# Patient Record
Sex: Female | Born: 1975 | Race: Black or African American | Hispanic: No | Marital: Married | Smoking: Current every day smoker
Health system: Southern US, Community
[De-identification: ages and names within clinical notes are randomized; demographics above are authoritative.]

## PROBLEM LIST (undated history)

## (undated) DIAGNOSIS — T4145XA Adverse effect of unspecified anesthetic, initial encounter: Secondary | ICD-10-CM

## (undated) DIAGNOSIS — N83209 Unspecified ovarian cyst, unspecified side: Secondary | ICD-10-CM

## (undated) DIAGNOSIS — F419 Anxiety disorder, unspecified: Secondary | ICD-10-CM

## (undated) DIAGNOSIS — Z9889 Other specified postprocedural states: Secondary | ICD-10-CM

## (undated) DIAGNOSIS — T8859XA Other complications of anesthesia, initial encounter: Secondary | ICD-10-CM

## (undated) DIAGNOSIS — R112 Nausea with vomiting, unspecified: Secondary | ICD-10-CM

## (undated) DIAGNOSIS — I10 Essential (primary) hypertension: Secondary | ICD-10-CM

## (undated) DIAGNOSIS — F329 Major depressive disorder, single episode, unspecified: Secondary | ICD-10-CM

## (undated) DIAGNOSIS — S92909A Unspecified fracture of unspecified foot, initial encounter for closed fracture: Secondary | ICD-10-CM

## (undated) DIAGNOSIS — F32A Depression, unspecified: Secondary | ICD-10-CM

## (undated) DIAGNOSIS — D259 Leiomyoma of uterus, unspecified: Secondary | ICD-10-CM

## (undated) DIAGNOSIS — K219 Gastro-esophageal reflux disease without esophagitis: Secondary | ICD-10-CM

## (undated) HISTORY — DX: Essential (primary) hypertension: I10

## (undated) HISTORY — PX: EYE SURGERY: SHX253

## (undated) HISTORY — PX: FOOT SURGERY: SHX648

## (undated) HISTORY — PX: HERNIA REPAIR: SHX51

## (undated) HISTORY — DX: Gastro-esophageal reflux disease without esophagitis: K21.9

## (undated) HISTORY — DX: Unspecified fracture of unspecified foot, initial encounter for closed fracture: S92.909A

---

## 1998-02-24 HISTORY — PX: TUBAL LIGATION: SHX77

## 2005-02-24 HISTORY — PX: NOVASURE ABLATION: SHX5394

## 2008-02-25 DIAGNOSIS — S92909A Unspecified fracture of unspecified foot, initial encounter for closed fracture: Secondary | ICD-10-CM

## 2008-02-25 HISTORY — DX: Unspecified fracture of unspecified foot, initial encounter for closed fracture: S92.909A

## 2010-04-05 ENCOUNTER — Emergency Department: Payer: Self-pay | Admitting: Emergency Medicine

## 2010-06-03 ENCOUNTER — Emergency Department: Payer: Self-pay | Admitting: Emergency Medicine

## 2011-09-16 ENCOUNTER — Emergency Department: Payer: Self-pay | Admitting: *Deleted

## 2011-09-18 LAB — BETA STREP CULTURE(ARMC)

## 2011-10-03 ENCOUNTER — Emergency Department: Payer: Self-pay | Admitting: Emergency Medicine

## 2012-04-01 ENCOUNTER — Emergency Department: Payer: Self-pay | Admitting: Emergency Medicine

## 2013-09-27 ENCOUNTER — Encounter: Payer: Self-pay | Admitting: General Surgery

## 2013-09-27 ENCOUNTER — Ambulatory Visit (INDEPENDENT_AMBULATORY_CARE_PROVIDER_SITE_OTHER): Payer: BC Managed Care – PPO | Admitting: General Surgery

## 2013-09-27 VITALS — BP 116/78 | HR 78 | Resp 12 | Ht 59.0 in | Wt 192.0 lb

## 2013-09-27 DIAGNOSIS — N764 Abscess of vulva: Secondary | ICD-10-CM

## 2013-09-27 NOTE — Progress Notes (Signed)
Patient ID: Deborah [name], female   DOB: July 28, 1975, 38 y.o.   MRN: [id number]  Chief Complaint  Patient presents with  . Other    New Pt evaluation of abscess on labia    HPI Deborah [name] is a 38 y.o. female who presents for an evaluation of an abscess on the labia. She states she noticed it approximately 1 week. She states as of last night it started to drain. She denies any fever or chills. She was prescribed an antibiotic yesterday but has not started it yet. She is having pain in this area. The patient has had abscesses in this area at least 4-5 times in the past.   HPI  Past Medical History  Diagnosis Date  . Broken foot 2010  . GERD (gastroesophageal reflux disease)     Past Surgical History  Procedure Laterality Date  . Tubal ligation  2000  . Eye surgery      tear duck    Family History  Problem Relation Age of Onset  . Hypertension Mother   . Hypertension Father   . Diabetes Sister     Social History History  Substance Use Topics  . Smoking status: Current Every Day Smoker -- 1.00 packs/day for 20 years  . Smokeless tobacco: Not on file  . Alcohol Use: Yes    No Known Allergies  Current Outpatient Prescriptions  Medication Sig Dispense Refill  . citalopram (CELEXA) 20 MG tablet Take 1 tablet by mouth daily.      Marland Kitchen lisinopril (PRINIVIL,ZESTRIL) 10 MG tablet Take 1 tablet by mouth daily.      . metFORMIN (GLUCOPHAGE) 500 MG tablet Take 1 tablet by mouth daily.      Marland Kitchen NEXIUM 40 MG capsule Take 1 capsule by mouth daily.      Marland Kitchen sulfamethoxazole-trimethoprim (BACTRIM DS) 800-160 MG per tablet        No current facility-administered medications for this visit.    Review of Systems Review of Systems  Constitutional: Negative.   Respiratory: Negative.   Cardiovascular: Negative.     Blood pressure 116/78, pulse 78, resp. rate 12, height 4\' 11"  (1.499 m), weight 192 lb (87.091 kg), last menstrual period 09/14/2013.  Physical Exam Physical Exam   Constitutional: She is oriented to person, place, and time. She appears well-developed and well-nourished.  Genitourinary:  Left labial skin 5 mm opening in the middle 2 cm induration. No active drainage.   Lymphadenopathy:       Right: No inguinal adenopathy present.       Left: No inguinal adenopathy present.  Neurological: She is alert and oriented to person, place, and time.  Skin: Skin is warm and dry.    Data Reviewed    Assessment    Left labial abscess.By history she has had recurrent abscesses in labial skin.      Plan    Patient to return in 4 weeks for follow up. Complete course of antibiotic prescribed.       [place] Natarajan G 09/27/2013, 9:46 AM

## 2013-09-27 NOTE — Patient Instructions (Addendum)
Patient to return in 4 weeks for follow up. Patient to start on antibiotics and complete 1 week course.  Also advised on clipping hair from the labial area once a week for hygiene. The patient is aware to call back for any questions or concerns.

## 2013-10-26 ENCOUNTER — Ambulatory Visit: Payer: BC Managed Care – PPO | Admitting: General Surgery

## 2013-11-10 ENCOUNTER — Encounter: Payer: Self-pay | Admitting: *Deleted

## 2013-12-27 ENCOUNTER — Encounter: Payer: Self-pay | Admitting: General Surgery

## 2014-03-17 ENCOUNTER — Emergency Department: Payer: Self-pay | Admitting: Emergency Medicine

## 2015-01-08 DIAGNOSIS — I10 Essential (primary) hypertension: Secondary | ICD-10-CM | POA: Insufficient documentation

## 2015-01-08 DIAGNOSIS — R7303 Prediabetes: Secondary | ICD-10-CM | POA: Insufficient documentation

## 2015-02-20 ENCOUNTER — Emergency Department
Admission: EM | Admit: 2015-02-20 | Discharge: 2015-02-20 | Disposition: A | Discharge status: Home / Self Care | Payer: BLUE CROSS/BLUE SHIELD | Attending: Emergency Medicine | Admitting: Emergency Medicine

## 2015-02-20 ENCOUNTER — Emergency Department: Payer: BLUE CROSS/BLUE SHIELD

## 2015-02-20 DIAGNOSIS — N946 Dysmenorrhea, unspecified: Secondary | ICD-10-CM | POA: Insufficient documentation

## 2015-02-20 DIAGNOSIS — N83202 Unspecified ovarian cyst, left side: Secondary | ICD-10-CM | POA: Insufficient documentation

## 2015-02-20 DIAGNOSIS — F17200 Nicotine dependence, unspecified, uncomplicated: Secondary | ICD-10-CM | POA: Insufficient documentation

## 2015-02-20 DIAGNOSIS — Z79899 Other long term (current) drug therapy: Secondary | ICD-10-CM | POA: Diagnosis not present

## 2015-02-20 DIAGNOSIS — Z3202 Encounter for pregnancy test, result negative: Secondary | ICD-10-CM | POA: Diagnosis not present

## 2015-02-20 DIAGNOSIS — R102 Pelvic and perineal pain: Secondary | ICD-10-CM

## 2015-02-20 DIAGNOSIS — R109 Unspecified abdominal pain: Secondary | ICD-10-CM | POA: Diagnosis present

## 2015-02-20 HISTORY — DX: Unspecified ovarian cyst, unspecified side: N83.209

## 2015-02-20 HISTORY — DX: Leiomyoma of uterus, unspecified: D25.9

## 2015-02-20 LAB — URINALYSIS COMPLETE WITH MICROSCOPIC (ARMC ONLY)
Bilirubin Urine: NEGATIVE
Glucose, UA: NEGATIVE mg/dL
KETONES UR: NEGATIVE mg/dL
Leukocytes, UA: NEGATIVE
Nitrite: NEGATIVE
PH: 5 (ref 5.0–8.0)
PROTEIN: 30 mg/dL — AB
SPECIFIC GRAVITY, URINE: 1.015 (ref 1.005–1.030)

## 2015-02-20 LAB — COMPREHENSIVE METABOLIC PANEL
ALK PHOS: 61 U/L (ref 38–126)
ALT: 21 U/L (ref 14–54)
ANION GAP: 9 (ref 5–15)
AST: 24 U/L (ref 15–41)
Albumin: 4.4 g/dL (ref 3.5–5.0)
BUN: 11 mg/dL (ref 6–20)
CALCIUM: 9.2 mg/dL (ref 8.9–10.3)
CHLORIDE: 107 mmol/L (ref 101–111)
CO2: 23 mmol/L (ref 22–32)
Creatinine, Ser: 0.82 mg/dL (ref 0.44–1.00)
GFR calc non Af Amer: >60 mL/min (ref >60)
Glucose, Bld: 104 mg/dL — ABNORMAL HIGH (ref 65–99)
POTASSIUM: 3.7 mmol/L (ref 3.5–5.1)
SODIUM: 139 mmol/L (ref 135–145)
Total Bilirubin: 0.5 mg/dL (ref 0.3–1.2)
Total Protein: 7.7 g/dL (ref 6.5–8.1)

## 2015-02-20 LAB — CBC
HCT: 41.6 % (ref 35.0–47.0)
HEMOGLOBIN: 13.8 g/dL (ref 12.0–16.0)
MCH: 29.3 pg (ref 26.0–34.0)
MCHC: 33.2 g/dL (ref 32.0–36.0)
MCV: 88.2 fL (ref 80.0–100.0)
Platelets: 322 10*3/uL (ref 150–440)
RBC: 4.72 MIL/uL (ref 3.80–5.20)
RDW: 14.6 % — ABNORMAL HIGH (ref 11.5–14.5)
WBC: 12.2 10*3/uL — ABNORMAL HIGH (ref 3.6–11.0)

## 2015-02-20 LAB — CHLAMYDIA/NGC RT PCR (ARMC ONLY)
CHLAMYDIA TR: NOT DETECTED
N GONORRHOEAE: NOT DETECTED

## 2015-02-20 LAB — WET PREP, GENITAL
Sperm: NONE SEEN
TRICH WET PREP: NONE SEEN
YEAST WET PREP: NONE SEEN

## 2015-02-20 LAB — LIPASE, BLOOD: LIPASE: 20 U/L (ref 11–51)

## 2015-02-20 LAB — POCT PREGNANCY, URINE: Preg Test, Ur: NEGATIVE

## 2015-02-20 MED ORDER — TRAMADOL HCL 50 MG PO TABS: 50.0000 mg | ORAL_TABLET | Freq: Four times a day (QID) | ORAL | Status: DC | PRN

## 2015-02-20 MED ORDER — OXYCODONE-ACETAMINOPHEN 5-325 MG PO TABS: 1.0000 | ORAL_TABLET | Freq: Once | ORAL | Status: DC

## 2015-02-20 MED ORDER — HYDROCODONE-ACETAMINOPHEN 5-325 MG PO TABS
1.0000 | ORAL_TABLET | Freq: Once | ORAL | Status: AC
  Administered 2015-02-20: 1 via ORAL

## 2015-02-20 MED ORDER — HYDROCODONE-ACETAMINOPHEN 5-325 MG PO TABS
ORAL_TABLET | ORAL | Status: AC
  Administered 2015-02-20: 1 via ORAL
  Filled 2015-02-20: qty 1

## 2015-02-20 MED ORDER — OXYCODONE-ACETAMINOPHEN 5-325 MG PO TABS
ORAL_TABLET | ORAL | Status: AC
  Filled 2015-02-20: qty 1

## 2015-02-20 NOTE — ED Notes (Signed)
Upon looking at prescription during discharge, pt states "I am not 245 lb, I am about 205 lb." Weight changed to 205 lb by this RN in chart.

## 2015-02-20 NOTE — ED Notes (Signed)
Patient transported to Ultrasound via stretcher 

## 2015-02-20 NOTE — ED Notes (Signed)
Pt returned from US

## 2015-02-20 NOTE — Discharge Instructions (Signed)

## 2015-02-20 NOTE — ED Notes (Signed)
Pt c/o lower abd pain for the past 3 days, worse today with normal period , states she has a hx of uterine fibroids and ovarian cyst that are worse when having her period.

## 2015-02-20 NOTE — ED Notes (Signed)

## 2015-02-20 NOTE — ED Provider Notes (Signed)
[facility] Emergency Department Provider Note  ____________________________________________   I have reviewed the triage vital signs and the nursing notes.   HISTORY  Chief Complaint Abdominal Pain    HPI Deborah [name] is a 39 y.o. female with a history of ovarian cysts, very painful uterine fibroids presents today complaining of pain during her menses. She has had it this bad before but not usually. She does also have some pain on the left side of her uterus which makes her concerned that she has a concomitant ovarian cyst which she has had multiple times. Her OB GYN has offered her a a hysterectomy but she prefer not to have it done. The patient is no longer thinking of having children however. She does not have significant vaginal bleeding she is on birth control for that and it is well-controlled.  Past Medical History  Diagnosis Date  . Broken foot 2010  . GERD (gastroesophageal reflux disease)   . Uterine fibroid   . Ovarian cyst     There are no active problems to display for this patient.   Past Surgical History  Procedure Laterality Date  . Tubal ligation  2000  . Eye surgery      tear duck    Current Outpatient Rx  Name  Route  Sig  Dispense  Refill  . ibuprofen (ADVIL,MOTRIN) 200 MG tablet   Oral   Take 200 mg by mouth every 6 (six) hours as needed.         Marland Kitchen lisinopril (PRINIVIL,ZESTRIL) 10 MG tablet   Oral   Take 1 tablet by mouth daily.         Marland Kitchen omeprazole (PRILOSEC) 20 MG capsule   Oral   Take 20 mg by mouth daily.           Allergies Review of patient's allergies indicates no known allergies.  Family History  Problem Relation Age of Onset  . Hypertension Mother   . Hypertension Father   . Diabetes Sister     Social History Social History  Substance Use Topics  . Smoking status: Current Every Day Smoker -- 1.00 packs/day for 20 years  . Smokeless tobacco: None  . Alcohol Use: Yes    Review of  Systems Constitutional: No fever/chills Eyes: No visual changes. ENT: No sore throat. No stiff neck no neck pain Cardiovascular: Denies chest pain. Respiratory: Denies shortness of breath. Gastrointestinal:   no vomiting.  No diarrhea.  No constipation. Genitourinary: Negative for dysuria. Musculoskeletal: Negative lower extremity swelling Skin: Negative for rash. Neurological: Negative for headaches, focal weakness or numbness. 10-point ROS otherwise negative.  ____________________________________________   PHYSICAL EXAM:  VITAL SIGNS: ED Triage Vitals  Enc Vitals Group     BP 02/20/15 1732 177/103 mmHg     Pulse Rate 02/20/15 1732 75     Resp 02/20/15 1732 18     Temp 02/20/15 1732 98.2 F (36.8 C)     Temp Source 02/20/15 1732 Oral     SpO2 02/20/15 1732 100 %     Weight 02/20/15 1732 245 lb (111.131 kg)     Height 02/20/15 1732 5\' 4"  (1.626 m)     Head Cir --      Peak Flow --      Pain Score 02/20/15 1732 10     Pain Loc --      Pain Edu? --      Excl. in [place]? --     Constitutional: Alert and oriented. Well  appearing and in no acute distress. Eyes: Conjunctivae are normal. PERRL. EOMI. Head: Atraumatic. Nose: No congestion/rhinnorhea. Mouth/Throat: Mucous membranes are moist.  Oropharynx non-erythematous. Neck: No stridor.   Nontender with no meningismus Cardiovascular: Normal rate, regular rhythm. Grossly normal heart sounds.  Good peripheral circulation. Respiratory: Normal respiratory effort.  No retractions. Lungs CTAB. Abdominal: Soft and nontender. No distention. No guarding no rebound Back:  There is no focal tenderness or step off there is no midline tenderness there are no lesions noted. there is no CVA tenderness Pelvic exam: Female nurse chaperone present, no external lesions noted, physiologic vaginal discharge noted with no purulent discharge, no cervical motion tenderness, significant obesity limits exam, there is scant vaginal bleeding, there is  normal uterine discomfort and there is some mild tenderness to palpation in the left adnexa.  Musculoskeletal: No lower extremity tenderness. No joint effusions, no DVT signs strong distal pulses no edema Neurologic:  Normal speech and language. No gross focal neurologic deficits are appreciated.  Skin:  Skin is warm, dry and intact. No rash noted. Psychiatric: Mood and affect are normal. Speech and behavior are normal.  ____________________________________________   LABS (all labs ordered are listed, but only abnormal results are displayed)  Labs Reviewed  WET PREP, GENITAL - Abnormal; Notable for the following:    Clue Cells Wet Prep HPF POC FEW (*)    WBC, Wet Prep HPF POC FEW (*)    All other components within normal limits  COMPREHENSIVE METABOLIC PANEL - Abnormal; Notable for the following:    Glucose, Bld 104 (*)    All other components within normal limits  CBC - Abnormal; Notable for the following:    WBC 12.2 (*)    RDW 14.6 (*)    All other components within normal limits  URINALYSIS COMPLETEWITH MICROSCOPIC ([facility] ONLY) - Abnormal; Notable for the following:    Color, Urine YELLOW (*)    APPearance CLEAR (*)    Hgb urine dipstick 3+ (*)    Protein, ur 30 (*)    Bacteria, UA RARE (*)    Squamous Epithelial / LPF 6-30 (*)    All other components within normal limits  CHLAMYDIA/NGC RT PCR ([facility] ONLY)  LIPASE, BLOOD  POC URINE PREG, ED  POCT PREGNANCY, URINE   ____________________________________________  EKG  I personally interpreted any EKGs ordered by me or triage  ____________________________________________  RADIOLOGY  I reviewed any imaging ordered by me or triage that were performed during my shift ____________________________________________   PROCEDURES  Procedure(s) performed: None  Critical Care performed: None  ____________________________________________   INITIAL IMPRESSION / ASSESSMENT AND PLAN / ED COURSE  Pertinent labs &  imaging results that were available during my care of the patient were reviewed by me and considered in my medical decision making (see chart for details).  We'll obtain a ultrasound to rule out torsion although I have low suspicion patient is very control after the pain medication that she received in triage. I suspect this is her chronic symptoms and barring unexpected findings on ultrasound anticipate the patient will follow closely with her OB/GYN for this chronic recurrent problem. ____________________________________________   FINAL CLINICAL IMPRESSION(S) / ED DIAGNOSES  Final diagnoses:  Pelvic pain in female     Schuyler Amor, MD 02/20/15 2156

## 2015-03-26 ENCOUNTER — Encounter: Payer: Self-pay | Admitting: Obstetrics and Gynecology

## 2015-04-11 ENCOUNTER — Encounter: Payer: Self-pay | Admitting: Obstetrics and Gynecology

## 2015-04-11 ENCOUNTER — Ambulatory Visit (INDEPENDENT_AMBULATORY_CARE_PROVIDER_SITE_OTHER): Payer: BLUE CROSS/BLUE SHIELD | Admitting: Obstetrics and Gynecology

## 2015-04-11 VITALS — BP 125/85 | HR 80 | Ht 59.0 in | Wt 212.0 lb

## 2015-04-11 DIAGNOSIS — R102 Pelvic and perineal pain: Principal | ICD-10-CM

## 2015-04-11 DIAGNOSIS — G8929 Other chronic pain: Secondary | ICD-10-CM

## 2015-04-11 DIAGNOSIS — Z9851 Tubal ligation status: Secondary | ICD-10-CM | POA: Diagnosis not present

## 2015-04-11 DIAGNOSIS — Z9889 Other specified postprocedural states: Secondary | ICD-10-CM

## 2015-04-11 DIAGNOSIS — N949 Unspecified condition associated with female genital organs and menstrual cycle: Secondary | ICD-10-CM | POA: Diagnosis not present

## 2015-04-11 DIAGNOSIS — N946 Dysmenorrhea, unspecified: Secondary | ICD-10-CM

## 2015-04-11 NOTE — Patient Instructions (Addendum)
1.  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  This is best treated with hysterectomy. 2.  History and physical is suspicious for pre-existing endometriosis.. 3.   Laparoscopic-assisted vaginal hysterectomy, bilateral salpingectomy, and left oophorectomy is to be scheduled within the next month. 4.  Return in approximately 4 weeks for preop appointment

## 2015-04-11 NOTE — Progress Notes (Signed)
GYN ENCOUNTER NOTE  Subjective:       Deborah [name] is a 40 y.o. G2P2 female is here for gynecologic evaluation of the following issues:  1. Chronic pelvic pain.  Patient is a 40 year old African-American female, divorced, para 2002, status post BTL for contraception, status post NovaSure endometrial ablation (unsuccessful), with worsening chronic pelvic pain in the form of dysmenorrhea and dyspareunia, presents for evaluation for possible surgical intervention..     Gynecologic History Menarche-age 79. Cycles.-Monthly. Duration of flow-less than 7 days. Dysmenorrhea-moderate to severe. Pelvic pain-central cramping, left-sided lower pelvic discomfort and low back ache with radiation to buttocks and left thigh. No previous diagnosis of endometriosis. History of worsening deep thrusting dyspareunia.. Status post NovaSure endometrial ablation approximately 2007 Patient's last menstrual period was 03/14/2015 (approximate). Contraception: tubal ligation Last Pap: normal.   Obstetric History OB History  Gravida Para Term Preterm AB SAB TAB Ectopic Multiple Living  2 2        2     # Outcome Date GA Lbr Len/2nd Weight Sex Delivery Anes PTL Lv  2 Para      Vag-Spont     1 Para      Vag-Spont       Obstetric Comments  1st Menstrual Cycle:  9  1st Pregnancy:  13    Past Medical History  Diagnosis Date  . Broken foot 2010  . GERD (gastroesophageal reflux disease)   . Uterine fibroid   . Ovarian cyst   . Hypertension   . Ovarian cyst     Past Surgical History  Procedure Laterality Date  . Tubal ligation  2000  . Eye surgery      tear duck  . Foot surgery Left   . Hernia repair    . Novasure ablation  2007    Current Outpatient Prescriptions on File Prior to Visit  Medication Sig Dispense Refill  . lisinopril (PRINIVIL,ZESTRIL) 10 MG tablet Take 1 tablet by mouth daily.    Marland Kitchen omeprazole (PRILOSEC) 20 MG capsule Take 20 mg by mouth daily.     No current  facility-administered medications on file prior to visit.    No Known Allergies  Social History   Social History  . Marital Status: Married    Spouse Name: N/A  . Number of Children: N/A  . Years of Education: N/A   Occupational History  . Not on file.   Social History Main Topics  . Smoking status: Current Every Day Smoker -- 1.00 packs/day for 20 years    Types: Cigarettes  . Smokeless tobacco: Not on file  . Alcohol Use: Yes     Comment: qod  . Drug Use: No  . Sexual Activity: Yes    Birth Control/ Protection: None   Other Topics Concern  . Not on file   Social History Narrative    Family History  Problem Relation Age of Onset  . Hypertension Mother   . Hypertension Father   . Diabetes Sister   . Migraines Paternal Grandmother   . Cancer Neg Hx   . Heart disease Neg Hx     The following portions of the patient's history were reviewed and updated as appropriate: allergies, current medications, past family history, past medical history, past social history, past surgical history and problem list.  Review of Systems Review of Systems - General ROS: negative for - chills, fatigue, fever, hot flashes, malaise or night sweats Hematological and Lymphatic ROS: negative for - bleeding problems or swollen lymph nodes  Gastrointestinal ROS: negative for - abdominal pain, blood in stools, change in bowel habits and nausea/vomiting Musculoskeletal ROS: negative for - joint pain, muscle pain or muscular weakness Genito-Urinary ROS: negative for - change in menstrual cycle,  , dysuria, genital discharge, genital ulcers, hematuria,  irregular/heavy menses, nocturia . POSITIVE-dysmenorrhea, dyspareunia, LLQ pelvic pain, incontinence  Objective:   BP 125/85 mmHg  Pulse 80  Ht 4\' 11"  (1.499 m)  Wt 212 lb (96.163 kg)  BMI 42.80 kg/m2  LMP 03/14/2015 (Approximate) CONSTITUTIONAL: Well-developed, well-nourished female in no acute distress.  HENT:  Normocephalic, atraumatic.   NECK: Normal range of motion, supple, no masses.  Normal thyroid.  SKIN: Skin is warm and dry. No rash noted. Not diaphoretic. No erythema. No pallor. [place]: Alert and oriented to person, place, and time. PSYCHIATRIC: Normal mood and affect. Normal behavior. Normal judgment and thought content. CARDIOVASCULAR:Not Examined RESPIRATORY: Not Examined BREASTS: Not Examined ABDOMEN: Soft, non distended; Non tender.  No Organomegaly. Moderate pannus PELVIC:  External Genitalia: Normal  BUS: Normal  Vagina: Normal  Cervix: Normal; mild cervical motion tenderness; no discharge  Uterus: Normal size, shape,consistency, mobile; mild tenderness  Adnexa: Normal-ithout masses or tenderness  RV: Normal external exam  Bladder: Nontender  Bony pelvis: Gynecoid MUSCULOSKELETAL: Normal range of motion. No tenderness.  No cyanosis, clubbing, or edema.     Assessment:   1. 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 Status post tubal ligation  3. Status post endometrial ablation  4. Dysmenorrhea  5.  Dyspareunia     Plan:   1.  LAVH bilateral salpingectomy and left oophorectomy. 2.  Return for preop appointment. 3.  Conservative medical options and surgical options  Were reviewed with multiple questions answered  Brayton Mars, MD  Note: This dictation was prepared with Dragon dictation along with smaller phrase technology. Any transcriptional errors that result from this process are unintentional.

## 2015-05-09 ENCOUNTER — Encounter: Payer: Self-pay | Admitting: *Deleted

## 2015-05-09 ENCOUNTER — Ambulatory Visit (INDEPENDENT_AMBULATORY_CARE_PROVIDER_SITE_OTHER): Payer: BLUE CROSS/BLUE SHIELD | Admitting: Obstetrics and Gynecology

## 2015-05-09 ENCOUNTER — Encounter: Payer: Self-pay | Admitting: Obstetrics and Gynecology

## 2015-05-09 ENCOUNTER — Other Ambulatory Visit: Payer: BLUE CROSS/BLUE SHIELD

## 2015-05-09 VITALS — BP 133/85 | HR 79 | Ht 59.0 in | Wt 214.8 lb

## 2015-05-09 DIAGNOSIS — N946 Dysmenorrhea, unspecified: Secondary | ICD-10-CM

## 2015-05-09 DIAGNOSIS — R102 Pelvic and perineal pain: Secondary | ICD-10-CM

## 2015-05-09 DIAGNOSIS — Z9851 Tubal ligation status: Secondary | ICD-10-CM

## 2015-05-09 DIAGNOSIS — Z9889 Other specified postprocedural states: Secondary | ICD-10-CM

## 2015-05-09 DIAGNOSIS — G8929 Other chronic pain: Secondary | ICD-10-CM

## 2015-05-09 DIAGNOSIS — N949 Unspecified condition associated with female genital organs and menstrual cycle: Secondary | ICD-10-CM

## 2015-05-09 DIAGNOSIS — Z01818 Encounter for other preprocedural examination: Secondary | ICD-10-CM

## 2015-05-09 DIAGNOSIS — I10 Essential (primary) hypertension: Secondary | ICD-10-CM

## 2015-05-09 NOTE — H&P (Signed)
Subjective:    PREOPERATIVE HISTORY AND PHYSICAL     Patient is a 40 y.o. G2P76female scheduled for LAVH with bilateral salpingectomy and left oophorectomy. Indications for procedure are chronic pelvic pain in the form of dysmenorrhea and dyspareunia. Pt is status post bilateral tubal ligation and NovaSure ablation (unsuccessful). Pt has had no acute illness. History of HTN controlled with lisinopril. No history of clots. History of dental caries with no crowns or dentures. Current medications: Vicodin, Lisinopril, Mobic, and Prilosec.    Pertinent Gynecological History: Menses: usually lasting less than 6 days and with severe dysmenorrhea Bleeding: intermenstrual bleeding Contraception: tubal ligation Last pap: normal  Discussed Blood/Blood Products: yes   Menstrual History: OB History    Gravida Para Term Preterm AB TAB SAB Ectopic Multiple Living   2 2        2       Obstetric Comments   1st Menstrual Cycle:  9 1st Pregnancy:  62      Menarche age: Unknown Patient's last menstrual period was 05/07/2015 (approximate).    Past Medical History  Diagnosis Date  . Broken foot 2010  . GERD (gastroesophageal reflux disease)   . Uterine fibroid   . Ovarian cyst   . Hypertension   . Ovarian cyst     Past Surgical History  Procedure Laterality Date  . Tubal ligation  2000  . Eye surgery      tear duck  . Foot surgery Left   . Hernia repair    . Novasure ablation  2007    OB History  Gravida Para Term Preterm AB SAB TAB Ectopic Multiple Living  2 2        2     # Outcome Date GA Lbr Len/2nd Weight Sex Delivery Anes PTL Lv  2 Para      Vag-Spont     1 Para      Vag-Spont       Obstetric Comments  1st Menstrual Cycle:  9  1st Pregnancy:  13    Social History   Social History  . Marital Status: Married    Spouse Name: N/A  . Number of Children: N/A  . Years of Education: N/A   Social History Main Topics  . Smoking status: Current Every Day Smoker -- 1.00  packs/day for 20 years    Types: Cigarettes  . Smokeless tobacco: None  . Alcohol Use: Yes     Comment: qod  . Drug Use: No  . Sexual Activity: Yes    Birth Control/ Protection: None   Other Topics Concern  . None   Social History Narrative    Family History  Problem Relation Age of Onset  . Hypertension Mother   . Hypertension Father   . Diabetes Sister   . Migraines Paternal Grandmother   . Cancer Neg Hx   . Heart disease Neg Hx      (Not in a hospital admission)  No Known Allergies  Review of Systems Constitutional: No recent fever/chills/sweats Respiratory: No recent cough/bronchitis Cardiovascular: No chest pain Gastrointestinal: No recent nausea/vomiting/diarrhea Genitourinary: No UTI symptoms Hematologic/lymphatic:No history of coagulopathy or recent blood thinner use    Objective:      BP 133/85 mmHg  Pulse 79  Ht 4\' 11"  (1.499 m)  Wt 214 lb 12.8 oz (97.433 kg)  BMI 43.36 kg/m2  LMP 05/07/2015 (Approximate)  General:   Normal  Skin:   normal  HEENT:  Normal  Neck:  Supple without Adenopathy or Thyromegaly  Lungs:   Heart:              Breasts:   Abdomen:  Pelvis:  M/S   Extremeties:  Neuro:    clear to auscultation bilaterally   Normal without murmur   Not Examined   soft, non-tender; bowel sounds normal; no masses,  no organomegaly   Exam deferred to OR  No CVAT  Warm/Dry   Normal          Assessment:  1. Ligation/ablation syndrome 2. Chronic pelvic pain   Plan:   LAVH bilateral salpingectomy with left oophorectomy   Preoperative counseling: Patient is to undergo LAVH bilateral salpingectomy with left oophorectomy on 05/14/2015. She is understanding of the planned procedure and is aware of and is accepting of all surgical risks which include but are not limited to bleeding, infection, pelvic organ injury with need for repair, blood clot disorders, anesthesia risks, etc. All questions have been answered. Informed consent is  given. Patient is ready and willing to proceed with surgery as scheduled.   Luz Lex, PA-S Brayton Mars, MD   I have seen, interviewed, and examined the patient in conjunction with the [facility]A. student and affirm the diagnosis and management plan. Martin A. DeFrancesco, MD, FACOG   Note: This dictation was prepared with Dragon dictation along with smaller phrase technology. Any transcriptional errors that result from this process are unintentional.

## 2015-05-09 NOTE — Patient Instructions (Signed)
1. Return in 1 week postop for incision check

## 2015-05-09 NOTE — Patient Instructions (Addendum)
  Your procedure is scheduled on: 05-14-15 (MONDAY) Report to [facility] To find out your arrival time please call [phone] between 1PM - 3PM on 05-11-15(FRIDAY)  Remember: Instructions that are not followed completely may result in serious medical risk, up to and including death, or upon the discretion of your surgeon and anesthesiologist your surgery may need to be rescheduled.    _X___ 1. Do not eat food or drink liquids after midnight. No gum chewing or hard candies.     _X___ 2. No Alcohol for 24 hours before or after surgery.   ____ 3. Bring all medications with you on the day of surgery if instructed.    _X___ 4. Notify your doctor if there is any change in your medical condition     (cold, fever, infections).     Do not wear jewelry, make-up, hairpins, clips or nail polish.  Do not wear lotions, powders, or perfumes. You may wear deodorant.  Do not shave 48 hours prior to surgery. Men may shave face and neck.  Do not bring valuables to the hospital.    [facility] is not responsible for any belongings or valuables.               Contacts, dentures or bridgework may not be worn into surgery.  Leave your suitcase in the car. After surgery it may be brought to your room.  For patients admitted to the hospital, discharge time is determined by your treatment team.   Patients discharged the day of surgery will not be allowed to drive home.   Please read over the following fact sheets that you were given:     _X___ Take these medicines the morning of surgery with A SIP OF WATER:    1. LISINOPRIL  2. PRILOSEC (OMEPRAZOLE)  3. TAKE A PRILOSEC ON Sunday NIGHT (05-13-15) BEFORE BED  4.  5.  6.  ____ Fleet Enema (as directed)   _X___ Use CHG Soap as directed  ____ Use inhalers on the day of surgery  ____ Stop metformin 2 days prior to surgery    ____ Take 1/2 of usual insulin dose the night before surgery and none on the morning of surgery.    ____ Stop Coumadin/Plavix/aspirin-N/A  _X___ Stop Anti-inflammatories-STOP MELOXICAM NOW-NO NSAIDS OR ASPIRIN PRODUCTS-HYDROCODONE OK TO CONTINUE   ____ Stop supplements until after surgery.    ____ Bring C-Pap to the hospital.

## 2015-05-09 NOTE — Progress Notes (Signed)
Subjective:    PREOPERATIVE HISTORY AND PHYSICAL     Patient is a 40 y.o. G2P38female scheduled for LAVH with bilateral salpingectomy and left oophorectomy. Indications for procedure are chronic pelvic pain in the form of dysmenorrhea and dyspareunia. Pt is status post bilateral tubal ligation and NovaSure ablation (unsuccessful). Pt has had no acute illness. History of HTN controlled with lisinopril. No history of clots. History of dental caries with no crowns or dentures. Current medications: Vicodin, Lisinopril, Mobic, and Prilosec.    Pertinent Gynecological History: Menses: usually lasting less than 6 days and with severe dysmenorrhea Bleeding: intermenstrual bleeding Contraception: tubal ligation Last pap: normal  Discussed Blood/Blood Products: yes   Menstrual History: OB History    Gravida Para Term Preterm AB TAB SAB Ectopic Multiple Living   2 2        2       Obstetric Comments   1st Menstrual Cycle:  9 1st Pregnancy:  54      Menarche age: Unknown Patient's last menstrual period was 05/07/2015 (approximate).    Past Medical History  Diagnosis Date  . Broken foot 2010  . GERD (gastroesophageal reflux disease)   . Uterine fibroid   . Ovarian cyst   . Hypertension   . Ovarian cyst     Past Surgical History  Procedure Laterality Date  . Tubal ligation  2000  . Eye surgery      tear duck  . Foot surgery Left   . Hernia repair    . Novasure ablation  2007    OB History  Gravida Para Term Preterm AB SAB TAB Ectopic Multiple Living  2 2        2     # Outcome Date GA Lbr Len/2nd Weight Sex Delivery Anes PTL Lv  2 Para      Vag-Spont     1 Para      Vag-Spont       Obstetric Comments  1st Menstrual Cycle:  9  1st Pregnancy:  13    Social History   Social History  . Marital Status: Married    Spouse Name: N/A  . Number of Children: N/A  . Years of Education: N/A   Social History Main Topics  . Smoking status: Current Every Day Smoker -- 1.00  packs/day for 20 years    Types: Cigarettes  . Smokeless tobacco: None  . Alcohol Use: Yes     Comment: qod  . Drug Use: No  . Sexual Activity: Yes    Birth Control/ Protection: None   Other Topics Concern  . None   Social History Narrative    Family History  Problem Relation Age of Onset  . Hypertension Mother   . Hypertension Father   . Diabetes Sister   . Migraines Paternal Grandmother   . Cancer Neg Hx   . Heart disease Neg Hx      (Not in a hospital admission)  No Known Allergies  Review of Systems Constitutional: No recent fever/chills/sweats Respiratory: No recent cough/bronchitis Cardiovascular: No chest pain Gastrointestinal: No recent nausea/vomiting/diarrhea Genitourinary: No UTI symptoms Hematologic/lymphatic:No history of coagulopathy or recent blood thinner use    Objective:      BP 133/85 mmHg  Pulse 79  Ht 4\' 11"  (1.499 m)  Wt 214 lb 12.8 oz (97.433 kg)  BMI 43.36 kg/m2  LMP 05/07/2015 (Approximate)  General:   Normal  Skin:   normal  HEENT:  Normal  Neck:  Supple without Adenopathy or Thyromegaly  Lungs:   Heart:              Breasts:   Abdomen:  Pelvis:  M/S   Extremeties:  Neuro:    clear to auscultation bilaterally   Normal without murmur   Not Examined   soft, non-tender; bowel sounds normal; no masses,  no organomegaly   Exam deferred to OR  No CVAT  Warm/Dry   Normal          Assessment:  1. Ligation/ablation syndrome 2. Chronic pelvic pain   Plan:   LAVH bilateral salpingectomy with left oophorectomy   Preoperative counseling: Patient is to undergo LAVH bilateral salpingectomy with left oophorectomy on 05/14/2015. She is understanding of the planned procedure and is aware of and is accepting of all surgical risks which include but are not limited to bleeding, infection, pelvic organ injury with need for repair, blood clot disorders, anesthesia risks, etc. All questions have been answered. Informed consent is  given. Patient is ready and willing to proceed with surgery as scheduled.   Luz Lex, PA-S Brayton Mars, MD   I have seen, interviewed, and examined the patient in conjunction with the [facility].A. student and affirm the diagnosis and management plan. Martin A. DeFrancesco, MD, FACOG   Note: This dictation was prepared with Dragon dictation along with smaller phrase technology. Any transcriptional errors that result from this process are unintentional.

## 2015-05-10 ENCOUNTER — Encounter
Admission: RE | Admit: 2015-05-10 | Discharge: 2015-05-10 | Disposition: A | Discharge status: Home / Self Care | Payer: BLUE CROSS/BLUE SHIELD | Source: Ambulatory Visit | Attending: Obstetrics and Gynecology | Admitting: Obstetrics and Gynecology

## 2015-05-10 DIAGNOSIS — Z01812 Encounter for preprocedural laboratory examination: Secondary | ICD-10-CM | POA: Insufficient documentation

## 2015-05-10 DIAGNOSIS — Z01810 Encounter for preprocedural cardiovascular examination: Secondary | ICD-10-CM | POA: Diagnosis present

## 2015-05-10 LAB — TYPE AND SCREEN
ABO/RH(D): O POS
Antibody Screen: NEGATIVE

## 2015-05-10 LAB — CBC WITH DIFFERENTIAL/PLATELET
BASOS ABS: 0.1 10*3/uL (ref 0–0.1)
BASOS PCT: 1 %
EOS ABS: 0.1 10*3/uL (ref 0–0.7)
EOS PCT: 1 %
HCT: 41.7 % (ref 35.0–47.0)
Hemoglobin: 13.7 g/dL (ref 12.0–16.0)
Lymphocytes Relative: 24 %
Lymphs Abs: 1.8 10*3/uL (ref 1.0–3.6)
MCH: 28.3 pg (ref 26.0–34.0)
MCHC: 33 g/dL (ref 32.0–36.0)
MCV: 85.8 fL (ref 80.0–100.0)
Monocytes Absolute: 0.5 10*3/uL (ref 0.2–0.9)
Monocytes Relative: 7 %
Neutro Abs: 5 10*3/uL (ref 1.4–6.5)
Neutrophils Relative %: 67 %
PLATELETS: 313 10*3/uL (ref 150–440)
RBC: 4.86 MIL/uL (ref 3.80–5.20)
RDW: 14.4 % (ref 11.5–14.5)
WBC: 7.5 10*3/uL (ref 3.6–11.0)

## 2015-05-10 LAB — ABO/RH: ABO/RH(D): O POS

## 2015-05-10 LAB — RAPID HIV SCREEN (HIV 1/2 AB+AG)
HIV 1/2 Antibodies: NONREACTIVE
HIV-1 P24 ANTIGEN - HIV24: NONREACTIVE

## 2015-05-11 DIAGNOSIS — Z09 Encounter for follow-up examination after completed treatment for conditions other than malignant neoplasm: Secondary | ICD-10-CM

## 2015-05-11 LAB — RPR: RPR Ser Ql: NONREACTIVE

## 2015-05-14 ENCOUNTER — Encounter
Admission: AD | Disposition: A | Discharge status: Home / Self Care | Payer: Self-pay | Source: Ambulatory Visit | Attending: Obstetrics and Gynecology

## 2015-05-14 ENCOUNTER — Ambulatory Visit: Payer: BLUE CROSS/BLUE SHIELD | Admitting: Certified Registered Nurse Anesthetist

## 2015-05-14 ENCOUNTER — Observation Stay
Admission: AD | Admit: 2015-05-14 | Discharge: 2015-05-15 | Disposition: A | Discharge status: Home / Self Care | Payer: BLUE CROSS/BLUE SHIELD | Source: Ambulatory Visit | Attending: Obstetrics and Gynecology | Admitting: Obstetrics and Gynecology

## 2015-05-14 ENCOUNTER — Encounter: Payer: Self-pay | Admitting: *Deleted

## 2015-05-14 DIAGNOSIS — N946 Dysmenorrhea, unspecified: Secondary | ICD-10-CM | POA: Insufficient documentation

## 2015-05-14 DIAGNOSIS — N949 Unspecified condition associated with female genital organs and menstrual cycle: Secondary | ICD-10-CM | POA: Diagnosis not present

## 2015-05-14 DIAGNOSIS — F17210 Nicotine dependence, cigarettes, uncomplicated: Secondary | ICD-10-CM | POA: Insufficient documentation

## 2015-05-14 DIAGNOSIS — R102 Pelvic and perineal pain: Secondary | ICD-10-CM | POA: Insufficient documentation

## 2015-05-14 DIAGNOSIS — N8302 Follicular cyst of left ovary: Secondary | ICD-10-CM | POA: Insufficient documentation

## 2015-05-14 DIAGNOSIS — N939 Abnormal uterine and vaginal bleeding, unspecified: Secondary | ICD-10-CM | POA: Insufficient documentation

## 2015-05-14 DIAGNOSIS — K219 Gastro-esophageal reflux disease without esophagitis: Secondary | ICD-10-CM | POA: Insufficient documentation

## 2015-05-14 DIAGNOSIS — G8929 Other chronic pain: Secondary | ICD-10-CM | POA: Diagnosis present

## 2015-05-14 DIAGNOSIS — N888 Other specified noninflammatory disorders of cervix uteri: Secondary | ICD-10-CM | POA: Insufficient documentation

## 2015-05-14 DIAGNOSIS — N8500 Endometrial hyperplasia, unspecified: Secondary | ICD-10-CM | POA: Insufficient documentation

## 2015-05-14 DIAGNOSIS — Z79891 Long term (current) use of opiate analgesic: Secondary | ICD-10-CM | POA: Insufficient documentation

## 2015-05-14 DIAGNOSIS — D259 Leiomyoma of uterus, unspecified: Principal | ICD-10-CM | POA: Insufficient documentation

## 2015-05-14 DIAGNOSIS — Z79899 Other long term (current) drug therapy: Secondary | ICD-10-CM | POA: Insufficient documentation

## 2015-05-14 DIAGNOSIS — I10 Essential (primary) hypertension: Secondary | ICD-10-CM | POA: Insufficient documentation

## 2015-05-14 DIAGNOSIS — N839 Noninflammatory disorder of ovary, fallopian tube and broad ligament, unspecified: Secondary | ICD-10-CM | POA: Diagnosis not present

## 2015-05-14 DIAGNOSIS — Z9889 Other specified postprocedural states: Secondary | ICD-10-CM | POA: Insufficient documentation

## 2015-05-14 DIAGNOSIS — Z791 Long term (current) use of non-steroidal anti-inflammatories (NSAID): Secondary | ICD-10-CM | POA: Insufficient documentation

## 2015-05-14 HISTORY — DX: Anxiety disorder, unspecified: F41.9

## 2015-05-14 HISTORY — DX: Other complications of anesthesia, initial encounter: T88.59XA

## 2015-05-14 HISTORY — DX: Depression, unspecified: F32.A

## 2015-05-14 HISTORY — PX: LAPAROSCOPIC VAGINAL HYSTERECTOMY WITH SALPINGO OOPHORECTOMY: SHX6681

## 2015-05-14 HISTORY — DX: Adverse effect of unspecified anesthetic, initial encounter: T41.45XA

## 2015-05-14 HISTORY — DX: Other specified postprocedural states: Z98.890

## 2015-05-14 HISTORY — DX: Major depressive disorder, single episode, unspecified: F32.9

## 2015-05-14 HISTORY — DX: Nausea with vomiting, unspecified: R11.2

## 2015-05-14 LAB — POCT PREGNANCY, URINE: PREG TEST UR: NEGATIVE

## 2015-05-14 SURGERY — HYSTERECTOMY, VAGINAL, LAPAROSCOPY-ASSISTED, WITH SALPINGO-OOPHORECTOMY
Anesthesia: General | Laterality: Bilateral

## 2015-05-14 MED ORDER — FENTANYL CITRATE (PF) 100 MCG/2ML IJ SOLN
INTRAMUSCULAR | Status: DC | PRN
  Administered 2015-05-14 (×3): 50 ug via INTRAVENOUS

## 2015-05-14 MED ORDER — MORPHINE SULFATE (PF) 2 MG/ML IV SOLN
1.0000 mg | INTRAVENOUS | Status: DC | PRN
  Administered 2015-05-14 – 2015-05-15 (×5): 2 mg via INTRAVENOUS
  Filled 2015-05-14 (×6): qty 1

## 2015-05-14 MED ORDER — SUCCINYLCHOLINE CHLORIDE 20 MG/ML IJ SOLN
INTRAMUSCULAR | Status: DC | PRN
  Administered 2015-05-14: 100 mg via INTRAVENOUS

## 2015-05-14 MED ORDER — HYDROMORPHONE HCL 1 MG/ML IJ SOLN
INTRAMUSCULAR | Status: DC | PRN
  Administered 2015-05-14 (×2): 0.5 mg via INTRAVENOUS

## 2015-05-14 MED ORDER — MIDAZOLAM HCL 2 MG/2ML IJ SOLN
INTRAMUSCULAR | Status: DC | PRN
  Administered 2015-05-14: 2 mg via INTRAVENOUS

## 2015-05-14 MED ORDER — LACTATED RINGERS IV SOLN: INTRAVENOUS | Status: DC

## 2015-05-14 MED ORDER — ACETAMINOPHEN 10 MG/ML IV SOLN
INTRAVENOUS | Status: AC
  Filled 2015-05-14: qty 100

## 2015-05-14 MED ORDER — ACETAMINOPHEN 325 MG PO TABS: 650.0000 mg | ORAL_TABLET | ORAL | Status: DC | PRN

## 2015-05-14 MED ORDER — LIDOCAINE HCL (CARDIAC) 20 MG/ML IV SOLN
INTRAVENOUS | Status: DC | PRN
  Administered 2015-05-14: 80 mg via INTRAVENOUS

## 2015-05-14 MED ORDER — SIMETHICONE 80 MG PO CHEW: 80.0000 mg | CHEWABLE_TABLET | Freq: Four times a day (QID) | ORAL | Status: DC | PRN

## 2015-05-14 MED ORDER — BISACODYL 10 MG RE SUPP: 10.0000 mg | Freq: Every day | RECTAL | Status: DC | PRN

## 2015-05-14 MED ORDER — FENTANYL CITRATE (PF) 100 MCG/2ML IJ SOLN
25.0000 ug | INTRAMUSCULAR | Status: DC | PRN
  Administered 2015-05-14 (×2): 25 ug via INTRAVENOUS

## 2015-05-14 MED ORDER — ONDANSETRON HCL 4 MG/2ML IJ SOLN: 4.0000 mg | Freq: Once | INTRAMUSCULAR | Status: DC | PRN

## 2015-05-14 MED ORDER — ROCURONIUM BROMIDE 100 MG/10ML IV SOLN
INTRAVENOUS | Status: DC | PRN
  Administered 2015-05-14: 5 mg via INTRAVENOUS
  Administered 2015-05-14 (×3): 10 mg via INTRAVENOUS
  Administered 2015-05-14: 15 mg via INTRAVENOUS
  Administered 2015-05-14: 10 mg via INTRAVENOUS

## 2015-05-14 MED ORDER — HYDROMORPHONE HCL 1 MG/ML IJ SOLN
INTRAMUSCULAR | Status: AC
Start: 2015-05-14 — End: 2015-05-14
  Filled 2015-05-14: qty 1

## 2015-05-14 MED ORDER — ONDANSETRON HCL 4 MG/2ML IJ SOLN
INTRAMUSCULAR | Status: DC | PRN
  Administered 2015-05-14: 4 mg via INTRAVENOUS

## 2015-05-14 MED ORDER — HYDROMORPHONE HCL 1 MG/ML IJ SOLN
0.2500 mg | INTRAMUSCULAR | Status: DC | PRN
  Administered 2015-05-14 (×2): 0.25 mg via INTRAVENOUS

## 2015-05-14 MED ORDER — KETOROLAC TROMETHAMINE 30 MG/ML IJ SOLN: 30.0000 mg | Freq: Four times a day (QID) | INTRAMUSCULAR | Status: DC

## 2015-05-14 MED ORDER — HYDROMORPHONE HCL 1 MG/ML IJ SOLN
INTRAMUSCULAR | Status: AC
  Administered 2015-05-14: 0.25 mg via INTRAVENOUS
  Filled 2015-05-14: qty 1

## 2015-05-14 MED ORDER — DEXAMETHASONE SODIUM PHOSPHATE 10 MG/ML IJ SOLN
INTRAMUSCULAR | Status: DC | PRN
  Administered 2015-05-14: 5 mg via INTRAVENOUS

## 2015-05-14 MED ORDER — CEFAZOLIN SODIUM-DEXTROSE 2-3 GM-% IV SOLR
2.0000 g | INTRAVENOUS | Status: AC
  Administered 2015-05-14: 2 g via INTRAVENOUS

## 2015-05-14 MED ORDER — OXYCODONE-ACETAMINOPHEN 5-325 MG PO TABS: 1.0000 | ORAL_TABLET | ORAL | Status: DC | PRN

## 2015-05-14 MED ORDER — KETOROLAC TROMETHAMINE 30 MG/ML IJ SOLN
30.0000 mg | Freq: Four times a day (QID) | INTRAMUSCULAR | Status: DC
  Administered 2015-05-14 – 2015-05-15 (×4): 30 mg via INTRAVENOUS
  Filled 2015-05-14 (×4): qty 1

## 2015-05-14 MED ORDER — DOCUSATE SODIUM 100 MG PO CAPS
100.0000 mg | ORAL_CAPSULE | Freq: Two times a day (BID) | ORAL | Status: DC
  Administered 2015-05-15: 100 mg via ORAL
  Filled 2015-05-14: qty 1

## 2015-05-14 MED ORDER — LACTATED RINGERS IV SOLN
INTRAVENOUS | Status: DC
  Administered 2015-05-14 (×3): via INTRAVENOUS

## 2015-05-14 MED ORDER — ACETAMINOPHEN 10 MG/ML IV SOLN
INTRAVENOUS | Status: DC | PRN
  Administered 2015-05-14: 1000 mg via INTRAVENOUS

## 2015-05-14 MED ORDER — SUGAMMADEX SODIUM 200 MG/2ML IV SOLN
INTRAVENOUS | Status: DC | PRN
  Administered 2015-05-14: 200 mg via INTRAVENOUS

## 2015-05-14 MED ORDER — PROPOFOL 10 MG/ML IV BOLUS
INTRAVENOUS | Status: DC | PRN
Start: 2015-05-14 — End: 2015-05-14
  Administered 2015-05-14: 150 mg via INTRAVENOUS
  Administered 2015-05-14: 20 mg via INTRAVENOUS

## 2015-05-14 MED ORDER — FENTANYL CITRATE (PF) 100 MCG/2ML IJ SOLN
INTRAMUSCULAR | Status: AC
  Administered 2015-05-14: 25 ug via INTRAVENOUS
  Filled 2015-05-14: qty 2

## 2015-05-14 MED ORDER — CEFAZOLIN SODIUM-DEXTROSE 2-3 GM-% IV SOLR
INTRAVENOUS | Status: AC
Start: 2015-05-14 — End: 2015-05-14
  Filled 2015-05-14: qty 50

## 2015-05-14 SURGICAL SUPPLY — 51 items
BAG URO DRAIN 2000ML W/SPOUT (MISCELLANEOUS) ×3 IMPLANT
BLADE SURG 15 STRL LF DISP TIS (BLADE) ×1 IMPLANT
BLADE SURG 15 STRL SS (BLADE) ×2
BLADE SURG SZ10 CARB STEEL (BLADE) ×3 IMPLANT
BLADE SURG SZ11 CARB STEEL (BLADE) ×3 IMPLANT
CATH FOLEY 2WAY  5CC 16FR (CATHETERS) ×2
CATH URTH 16FR FL 2W BLN LF (CATHETERS) ×1 IMPLANT
CHLORAPREP W/TINT 26ML (MISCELLANEOUS) ×3 IMPLANT
CLOSURE WOUND 1/2 X4 (GAUZE/BANDAGES/DRESSINGS) ×1
DRSG TEGADERM 2X2.25 PEDS (GAUZE/BANDAGES/DRESSINGS) ×9 IMPLANT
ELECT REM PT RETURN 9FT ADLT (ELECTROSURGICAL) ×3
ELECTRODE REM PT RTRN 9FT ADLT (ELECTROSURGICAL) ×1 IMPLANT
FILTER LAP SMOKE EVAC STRL (MISCELLANEOUS) ×3 IMPLANT
GAUZE PACK 2X3YD (MISCELLANEOUS) ×3 IMPLANT
GLOVE BIO SURGEON STRL SZ 6 (GLOVE) ×3 IMPLANT
GLOVE BIO SURGEON STRL SZ 6.5 (GLOVE) ×2 IMPLANT
GLOVE BIO SURGEON STRL SZ8 (GLOVE) ×18 IMPLANT
GLOVE BIO SURGEONS STRL SZ 6.5 (GLOVE) ×1
GOWN STRL REUS W/ TWL LRG LVL3 (GOWN DISPOSABLE) ×2 IMPLANT
GOWN STRL REUS W/ TWL XL LVL3 (GOWN DISPOSABLE) ×1 IMPLANT
GOWN STRL REUS W/TWL LRG LVL3 (GOWN DISPOSABLE) ×4
GOWN STRL REUS W/TWL XL LVL3 (GOWN DISPOSABLE) ×2
HANDLE YANKAUER SUCT BULB TIP (MISCELLANEOUS) ×3 IMPLANT
IRRIGATION STRYKERFLOW (MISCELLANEOUS) ×1 IMPLANT
IRRIGATOR STRYKERFLOW (MISCELLANEOUS) ×3
IV LACTATED RINGERS 1000ML (IV SOLUTION) ×3 IMPLANT
KIT PINK PAD W/HEAD ARE REST (MISCELLANEOUS) ×3
KIT PINK PAD W/HEAD ARM REST (MISCELLANEOUS) ×1 IMPLANT
KIT RM TURNOVER CYSTO AR (KITS) ×3 IMPLANT
LABEL OR SOLS (LABEL) ×3 IMPLANT
LIQUID BAND (GAUZE/BANDAGES/DRESSINGS) ×3 IMPLANT
PACK BASIN MINOR ARMC (MISCELLANEOUS) ×3 IMPLANT
PACK GYN LAPAROSCOPIC (MISCELLANEOUS) ×3 IMPLANT
PAD OB MATERNITY 4.3X12.25 (PERSONAL CARE ITEMS) ×3 IMPLANT
PENCIL ELECTRO HAND CTR (MISCELLANEOUS) ×3 IMPLANT
SCISSORS METZENBAUM CVD 33 (INSTRUMENTS) IMPLANT
SHEARS HARMONIC ACE PLUS 36CM (ENDOMECHANICALS) ×3 IMPLANT
SLEEVE ENDOPATH XCEL 5M (ENDOMECHANICALS) ×6 IMPLANT
SPONGE XRAY 4X4 16PLY STRL (MISCELLANEOUS) ×3 IMPLANT
STRIP CLOSURE SKIN 1/2X4 (GAUZE/BANDAGES/DRESSINGS) ×2 IMPLANT
SUT CHROMIC 2 0 CT 1 (SUTURE) ×12 IMPLANT
SUT VIC AB 0 CT1 27 (SUTURE) ×4
SUT VIC AB 0 CT1 27XCR 8 STRN (SUTURE) ×2 IMPLANT
SUT VIC AB 0 CT1 36 (SUTURE) IMPLANT
SUT VIC AB 0 CT2 27 (SUTURE) IMPLANT
SUT VIC AB 2-0 CT1 (SUTURE) IMPLANT
SUT VIC AB 2-0 CT1 36 (SUTURE) ×3 IMPLANT
SUT VIC AB 4-0 FS2 27 (SUTURE) IMPLANT
SYRINGE 10CC LL (SYRINGE) ×3 IMPLANT
TROCAR XCEL NON-BLD 5MMX100MML (ENDOMECHANICALS) ×3 IMPLANT
TUBING INSUFFLATOR HEATED (MISCELLANEOUS) ×3 IMPLANT

## 2015-05-14 NOTE — H&P (View-Only) (Signed)
Subjective:    PREOPERATIVE HISTORY AND PHYSICAL     Patient is a 40 y.o. G2P53female scheduled for LAVH with bilateral salpingectomy and left oophorectomy. Indications for procedure are chronic pelvic pain in the form of dysmenorrhea and dyspareunia. Pt is status post bilateral tubal ligation and NovaSure ablation (unsuccessful). Pt has had no acute illness. History of HTN controlled with lisinopril. No history of clots. History of dental caries with no crowns or dentures. Current medications: Vicodin, Lisinopril, Mobic, and Prilosec.    Pertinent Gynecological History: Menses: usually lasting less than 6 days and with severe dysmenorrhea Bleeding: intermenstrual bleeding Contraception: tubal ligation Last pap: normal  Discussed Blood/Blood Products: yes   Menstrual History: OB History    Gravida Para Term Preterm AB TAB SAB Ectopic Multiple Living   2 2        2       Obstetric Comments   1st Menstrual Cycle:  9 1st Pregnancy:  78      Menarche age: Unknown Patient's last menstrual period was 05/07/2015 (approximate).    Past Medical History  Diagnosis Date  . Broken foot 2010  . GERD (gastroesophageal reflux disease)   . Uterine fibroid   . Ovarian cyst   . Hypertension   . Ovarian cyst     Past Surgical History  Procedure Laterality Date  . Tubal ligation  2000  . Eye surgery      tear duck  . Foot surgery Left   . Hernia repair    . Novasure ablation  2007    OB History  Gravida Para Term Preterm AB SAB TAB Ectopic Multiple Living  2 2        2     # Outcome Date GA Lbr Len/2nd Weight Sex Delivery Anes PTL Lv  2 Para      Vag-Spont     1 Para      Vag-Spont       Obstetric Comments  1st Menstrual Cycle:  11  1st Pregnancy:  13    Social History   Social History  . Marital Status: Married    Spouse Name: N/A  . Number of Children: N/A  . Years of Education: N/A   Social History Main Topics  . Smoking status: Current Every Day Smoker -- 1.00  packs/day for 20 years    Types: Cigarettes  . Smokeless tobacco: None  . Alcohol Use: Yes     Comment: qod  . Drug Use: No  . Sexual Activity: Yes    Birth Control/ Protection: None   Other Topics Concern  . None   Social History Narrative    Family History  Problem Relation Age of Onset  . Hypertension Mother   . Hypertension Father   . Diabetes Sister   . Migraines Paternal Grandmother   . Cancer Neg Hx   . Heart disease Neg Hx      (Not in a hospital admission)  No Known Allergies  Review of Systems Constitutional: No recent fever/chills/sweats Respiratory: No recent cough/bronchitis Cardiovascular: No chest pain Gastrointestinal: No recent nausea/vomiting/diarrhea Genitourinary: No UTI symptoms Hematologic/lymphatic:No history of coagulopathy or recent blood thinner use    Objective:      BP 133/85 mmHg  Pulse 79  Ht 4\' 11"  (1.499 m)  Wt 214 lb 12.8 oz (97.433 kg)  BMI 43.36 kg/m2  LMP 05/07/2015 (Approximate)  General:   Normal  Skin:   normal  HEENT:  Normal  Neck:  Supple without Adenopathy or Thyromegaly  Lungs:   Heart:              Breasts:   Abdomen:  Pelvis:  M/S   Extremeties:  Neuro:    clear to auscultation bilaterally   Normal without murmur   Not Examined   soft, non-tender; bowel sounds normal; no masses,  no organomegaly   Exam deferred to OR  No CVAT  Warm/Dry   Normal          Assessment:  1. Ligation/ablation syndrome 2. Chronic pelvic pain   Plan:   LAVH bilateral salpingectomy with left oophorectomy   Preoperative counseling: Patient is to undergo LAVH bilateral salpingectomy with left oophorectomy on 05/14/2015. She is understanding of the planned procedure and is aware of and is accepting of all surgical risks which include but are not limited to bleeding, infection, pelvic organ injury with need for repair, blood clot disorders, anesthesia risks, etc. All questions have been answered. Informed consent is  given. Patient is ready and willing to proceed with surgery as scheduled.   Luz Lex, PA-S Brayton Mars, MD   I have seen, interviewed, and examined the patient in conjunction with the [facility].A. student and affirm the diagnosis and management plan. Martin A. DeFrancesco, MD, FACOG   Note: This dictation was prepared with Dragon dictation along with smaller phrase technology. Any transcriptional errors that result from this process are unintentional.

## 2015-05-14 NOTE — Anesthesia Preprocedure Evaluation (Signed)
Anesthesia Evaluation  Patient identified by MRN, date of birth, ID band Patient awake    Reviewed: Allergy & Precautions, H&P , NPO status , Patient's Chart, lab work & pertinent test results, reviewed documented beta blocker date and time   History of Anesthesia Complications (+) PONV and history of anesthetic complications  Airway Mallampati: III  TM Distance: >3 FB Neck ROM: full    Dental  (+) Teeth Intact   Pulmonary neg pulmonary ROS, Current Smoker,    Pulmonary exam normal        Cardiovascular hypertension, negative cardio ROS Normal cardiovascular exam Rhythm:regular Rate:Normal     Neuro/Psych PSYCHIATRIC DISORDERS negative neurological ROS  negative psych ROS   GI/Hepatic negative GI ROS, Neg liver ROS, GERD  ,  Endo/Other  negative endocrine ROSMorbid obesity  Renal/GU negative Renal ROS  negative genitourinary   Musculoskeletal   Abdominal   Peds  Hematology negative hematology ROS (+)   Anesthesia Other Findings Past Medical History:   Broken foot                                     2010         GERD (gastroesophageal reflux disease)                       Uterine fibroid                                              Ovarian cyst                                                 Hypertension                                                 Ovarian cyst                                                 Anxiety                                                      Depression                                                   Complication of anesthesia                                   PONV (postoperative nausea and vomiting)  Past Surgical History:   TUBAL LIGATION                                   2000         EYE SURGERY                                                     Comment:tear duck   FOOT SURGERY                                    Left              HERNIA REPAIR                                                  [place] ABLATION                                2007       BMI    Body Mass Index   43.19 kg/m 2     Reproductive/Obstetrics negative OB ROS                             Anesthesia Physical Anesthesia Plan  ASA: III  Anesthesia Plan: General ETT   Post-op Pain Management:    Induction:   Airway Management Planned:   Additional Equipment:   Intra-op Plan:   Post-operative Plan:   Informed Consent: I have reviewed the patients History and Physical, chart, labs and discussed the procedure including the risks, benefits and alternatives for the proposed anesthesia with the patient or authorized representative who has indicated his/her understanding and acceptance.   Dental Advisory Given  Plan Discussed with: CRNA  Anesthesia Plan Comments:         Anesthesia Quick Evaluation

## 2015-05-14 NOTE — Interval H&P Note (Signed)
History and Physical Interval Note:  05/14/2015 12:51 PM  Deborah [name]  has presented today for surgery, with the diagnosis of Chronic pelvic pain,Dysmenorrhea  The various methods of treatment have been discussed with the patient and family. After consideration of risks, benefits and other options for treatment, the patient has consented to  Procedure(s): LAPAROSCOPIC ASSISTED VAGINAL HYSTERECTOMY WITH LEFT OOPHERECTOMY, BILATERAL SALPINGECTOMY (Bilateral) as a surgical intervention .  The patient's history has been reviewed, patient examined, no change in status, stable for surgery.  I have reviewed the patient's chart and labs.  Questions were answered to the patient's satisfaction.     Hassell Done A Defrancesco

## 2015-05-14 NOTE — Op Note (Signed)
Deborah [name] PROCEDURE DATE: 05/14/2015   PREOPERATIVE DIAGNOSIS:  1. Chronic pelvic pain 2. Abnormal uterine bleeding 3. Ligation/ablation syndrome  POSTOPERATIVE DIAGNOSIS: The same PROCEDURE: Laparoscopic assisted vaginal hysterectomy, bilateral salpingectomy, left oophorectomy SURGEON:  Brayton Mars, MD ASSISTANT: Dr. Dr. Marcelline Mates; PA-S Olene Floss  INDICATIONS: 40 y.o. G2P2 with aforementioned preoperative diagnoses here today for definitive surgical management.   Risks of surgery were discussed with the patient including but not limited to: bleeding which may require transfusion or reoperation; infection which may require antibiotics; injury to bowel, bladder, ureters or other surrounding organs; need for additional procedures including laparotomy; thromboembolic phenomenon, incisional problems and other postoperative/anesthesia complications. Written informed consent was obtained.    FINDINGS:  Small fibroid uterus, normal adnexa bilaterally, status post tubal ligation.  No evidence of endometriosis, adhesions or any other abdominal/pelvic abnormality.  Normal upper abdomen.  ANESTHESIA:    General INTRAVENOUS FLUIDS: 1500 ml ESTIMATED BLOOD LOSS: 200 ml SPECIMENS: Uterus, cervix, bilateral fallopian tubes and ovaries. COMPLICATIONS: None immediate   LAVH bilateral salpingectomy and left oophorectomy  PROCEDURE IN DETAIL:  The patient received intravenous antibiotics and had sequential compression devices applied to her lower extremities while in the preoperative area.  She was then taken to the operating room where general anesthesia was administered and was found to be adequate.  She was placed in the dorsal lithotomy position, and was prepped and draped in a sterile manner.  A Foley catheter was inserted into her bladder and attached to constant drainage and a uterine manipulator was then advanced into the uterus .  After an adequate timeout was performed, attention  was then turned to the patient's abdomen where a 5 skin incision was made in the umbilical fold.  Direct entry could not be completed, therefore left upper quadrant entry with a 5 mm port was performed with success. CO2 pneumoperitoneum was created. A survey of the patient's pelvis and abdomen revealed the findings above.   Bilateral 5-mm lower quadrant ports were then placed under direct visualization.   The round ligaments were then clamped and transected with the Ace Harmonic scalpel on both sides. The left infundibulopelvic ligament was  also clamped and transected with the Ace Harmonic scalpel. The right tube residual segment was excised with the Ace Harmonic scalpel along the mesosalpinx. The cardinal broad ligament complexes were sequentially clamped coagulated and cut down to the level of the uterosacral ligament. The bladder flap was created with the Harmonic scalpel using sharp and blunt dissection. Excellent hemostasis was noted, the decision was made to leave the trocars in place and proceed with completing the hysterectomy via the vaginal route .  Attention was then turned to her pelvis.  A weighted speculum was then placed in the vagina, and the anterior and posterior lips of the cervix were grasped bilaterally with tenaculums. Posterior colpotomy was made with Mayo scissors. Uterosacral ligaments were clamped cut and stick tied using 0 Vicryl suture.  The cervix was then circumferentially incised with the Bovie cautery and the anterior cul-de-sac was then entered sharply without difficulty and a retractor was placed.    A long weighted speculum was inserted into the posterior cul-de-sac.  The Heaney clamp was then used to clamp the remaining cardinal/broad ligament complexes on either side.  They were then cut and sutured ligated with 0 Vicryl.  Of note, all sutures used in this case were 0 Vicryl unless otherwise noted.  The uterine vessels and broad ligaments were then serially clamped with the  Heaney clamps, cut, and suture ligated on both sides.  The uterus, left tube and ovary was noted to be freed from all ligaments and was then delivered and sent to pathology.   After completion of the hysterectomy, all pedicles from the uterosacral ligament to the cornua were examined hemostasis was confirmed.   The vaginal cuff was then closed in a series of simple interrupted sutures with 0 Vicryl with care given to incorporate the uterosacral pedicles bilaterally.  All instruments were then removed from the pelvis.  Attention was then returned to her abdomen which was insufflated again with carbon dioxide gas.  The laparoscope was used to survey the operative site, and it was found to be hemostatic.   No intraoperative injury to other surrounding organs was noted.  The abdomen was desufflated and all instruments were then removed from the patient's abdomen.    All skin incisions were closed with 4-0 Monocryl suture. Tegaderm and Telfa dressings were applied to the incisions. The patient tolerated the procedures well.  All instruments, needles, and sponge counts were correct x 2. The patient was taken to the recovery room awake, extubated and in stable condition.

## 2015-05-14 NOTE — Anesthesia Procedure Notes (Signed)
Procedure Name: Intubation Date/Time: 05/14/2015 1:26 PM Performed by: Johnna Acosta Pre-anesthesia Checklist: Patient identified, Emergency Drugs available, Suction available, Patient being monitored and Timeout performed Patient Re-evaluated:Patient Re-evaluated prior to inductionOxygen Delivery Method: Circle system utilized Preoxygenation: Pre-oxygenation with 100% oxygen Intubation Type: IV induction Ventilation: Two handed mask ventilation required and Oral airway inserted - appropriate to patient size Laryngoscope Size: Sabra Heck and 2 Grade View: Grade II Tube type: Oral Tube size: 7.5 mm Number of attempts: 1 Airway Equipment and Method: Stylet Placement Confirmation: ETT inserted through vocal cords under direct vision,  positive ETCO2 and breath sounds checked- equal and bilateral Secured at: 21 cm Tube secured with: Tape Dental Injury: Teeth and Oropharynx as per pre-operative assessment  Difficulty Due To: Difficult Airway- due to anterior larynx and Difficult Airway- due to large tongue

## 2015-05-14 NOTE — Transfer of Care (Signed)
Immediate Anesthesia Transfer of Care Note  Patient: Deborah [name]  Procedure(s) Performed: Procedure(s): LAPAROSCOPIC ASSISTED VAGINAL HYSTERECTOMY WITH LEFT OOPHERECTOMY, BILATERAL SALPINGECTOMY (Bilateral)  Patient Location: PACU  Anesthesia Type:General  Level of Consciousness: sedated  Airway & Oxygen Therapy: Patient Spontanous Breathing and Patient connected to face mask oxygen  Post-op Assessment: Report given to RN and Post -op Vital signs reviewed and stable  Post vital signs: Reviewed and stable  Last Vitals:  Filed Vitals:   05/14/15 1057  BP: 154/94  Pulse: 64  Temp: 37 C  Resp: 18    Complications: [name] apparent anesthesia complications

## 2015-05-15 ENCOUNTER — Encounter: Payer: Self-pay | Admitting: Obstetrics and Gynecology

## 2015-05-15 ENCOUNTER — Telehealth: Payer: Self-pay | Admitting: Obstetrics and Gynecology

## 2015-05-15 LAB — HEMOGLOBIN: Hemoglobin: 12 g/dL (ref 12.0–16.0)

## 2015-05-15 MED ORDER — DOCUSATE SODIUM 100 MG PO CAPS: 100.0000 mg | ORAL_CAPSULE | Freq: Two times a day (BID) | ORAL | Status: DC

## 2015-05-15 NOTE — Anesthesia Postprocedure Evaluation (Signed)
Anesthesia Post Note  Patient: Deborah [name]  Procedure(s) Performed: Procedure(s) (LRB): LAPAROSCOPIC ASSISTED VAGINAL HYSTERECTOMY WITH LEFT OOPHERECTOMY, BILATERAL SALPINGECTOMY (Bilateral)  Patient location during evaluation: PACU Anesthesia Type: General Level of consciousness: awake and alert Pain management: pain level controlled Vital Signs Assessment: post-procedure vital signs reviewed and stable Respiratory status: spontaneous breathing, nonlabored ventilation, respiratory function stable and patient connected to nasal cannula oxygen Cardiovascular status: blood pressure returned to baseline and stable Postop Assessment: no signs of nausea or vomiting Anesthetic complications: no    Last Vitals:  Filed Vitals:   05/15/15 0302 05/15/15 0730  BP: 130/74 127/78  Pulse: 80 66  Temp: 37.1 C 37.2 C  Resp: 16 16    Last Pain:  Filed Vitals:   05/15/15 0938  PainSc: Ralston Adams

## 2015-05-15 NOTE — Telephone Encounter (Signed)
Per mad ok to send in colace bid. Pt aware.

## 2015-05-15 NOTE — Telephone Encounter (Signed)
PT CALLED AND SHE HAD SURGERY ON Monday AND DR DE DISCHARGED HER TODAY AND TOLD HER HE WAS CALLING IN A SCRIPT FOR HER SHE THINKS ITS THE [facility] AND THEY DO NOT HAVE THE RX AT [place] [facility], SHE USES [facility]

## 2015-05-15 NOTE — Discharge Summary (Signed)
Physician Discharge Summary  Patient ID: Deborah [name] MRN: [id number] DOB/AGE: 07/25/1975 40 y.o.  Admit date: 05/14/2015 Discharge date: 05/15/2015  Admission Diagnoses: Ablation/Ligation Syndrome, Chronic Pelvic Pain and Fibroids  Discharge Diagnoses:  SAA  Operative Procedures: Procedure(s): LAPAROSCOPIC ASSISTED VAGINAL HYSTERECTOMY WITH LEFT OOPHERECTOMY, BILATERAL SALPINGECTOMY (Bilateral)  Hospital Course: Uncomplicated.   Significant Diagnostic Studies:  Lab Results  Component Value Date   HGB 12.0 05/15/2015   HGB 13.7 05/10/2015   HGB 13.8 02/20/2015   Lab Results  Component Value Date   HCT 41.7 05/10/2015   HCT 41.6 02/20/2015   CBC Latest Ref Rng 05/15/2015 05/10/2015 02/20/2015  WBC 3.6 - 11.0 K/uL - 7.5 12.2(H)  Hemoglobin 12.0 - 16.0 g/dL 12.0 13.7 13.8  Hematocrit 35.0 - 47.0 % - 41.7 41.6  Platelets 150 - 440 K/uL - 313 322     Discharged Condition: good  Discharge Exam: Blood pressure 124/66, pulse 73, temperature 98.6 F (37 C), temperature source Axillary, resp. rate 18, height 4\' 11"  (1.499 m), weight 214 lb (97.07 kg), last menstrual period 05/07/2015, SpO2 100 %. Incision/Wound: clean, dry and no drainage  Disposition: 01-Home or Self Care     Medication List    ASK your doctor about these medications        HYDROcodone-acetaminophen 5-325 MG tablet  Commonly known as:  NORCO/VICODIN     lisinopril 10 MG tablet  Commonly known as:  PRINIVIL,ZESTRIL  Take 1 tablet by mouth every morning.     meloxicam 15 MG tablet  Commonly known as:  MOBIC     omeprazole 20 MG capsule  Commonly known as:  PRILOSEC  Take 20 mg by mouth every morning.           Follow-up Information    Follow up with Brayton Mars, MD. Schedule an appointment as soon as possible for a visit in 1 week.   Specialties:  Obstetrics and Gynecology, Radiology   Why:  Post Op Check   Contact information:   [address] [place]  [address] [phone]       Signed: Alanda Slim Zeanna Sunde 05/15/2015, 1:03 PM

## 2015-05-15 NOTE — Progress Notes (Addendum)
Dr. Enzo Bi was unable to put in Discharge order. Gave verbal order. Patient getting very irritated. Copy and pasted D/C instructions. Instructed pt proper incision care and need to make follow-up appointment.

## 2015-05-16 LAB — SURGICAL PATHOLOGY

## 2015-05-24 ENCOUNTER — Ambulatory Visit (INDEPENDENT_AMBULATORY_CARE_PROVIDER_SITE_OTHER): Payer: BLUE CROSS/BLUE SHIELD | Admitting: Obstetrics and Gynecology

## 2015-05-24 ENCOUNTER — Encounter: Payer: Self-pay | Admitting: Obstetrics and Gynecology

## 2015-05-24 VITALS — BP 129/90 | HR 76 | Ht 59.0 in | Wt 212.9 lb

## 2015-05-24 DIAGNOSIS — Z90710 Acquired absence of both cervix and uterus: Secondary | ICD-10-CM

## 2015-05-24 DIAGNOSIS — Z09 Encounter for follow-up examination after completed treatment for conditions other than malignant neoplasm: Secondary | ICD-10-CM

## 2015-05-24 NOTE — Progress Notes (Signed)
Chief complaint: 1. One week postop check 2. Status post LAVH bilateral salpingectomy left oophorectomy  Patient is doing really well. Bowel bladder function are normal. No significant pain is encountered. Patient desires to go back to work.Marland Kitchen  PATHOLOGY: DIAGNOSIS:  A. UTERUS WITH CERVIX, BILATERAL FALLOPIAN TUBES AND LEFT OVARY;  HYSTERECTOMY WITH BILATERAL SALPINGECTOMY AND LEFT OOPHORECTOMY:  - CERVIX WITH NABOTHIAN CYSTS AND SIMPLE CYST (0.5 CM).  - PROLIFERATIVE ENDOMETRIUM.  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  - HEMORRHAGIC FOLLICULAR CYSTS OF LEFT OVARY.  - BILATERAL FALLOPIAN TUBES WITHOUT PATHOLOGIC CHANGE.   OBJECTIVE: BP 129/90 mmHg  Pulse 76  Ht 4\' 11"  (1.499 m)  Wt 212 lb 14.4 oz (96.571 kg)  BMI 42.98 kg/m2  LMP 05/07/2015 (Approximate) Pleasant well-appearing female in no acute distress Abdomen: Soft, nontender; laparoscopy incision sites are well approximated without evidence of hernia (bandages are removed)  ASSESSMENT: 1. One week postop check-well 2. Status post LAVH bilateral salpingectomy and left oophorectomy  PLAN: 1. Resume activities as tolerated. Patient may return to work and may return to the gym doing light activities 2. Return in 5 weeks for final postop check  Brayton Mars, MD  Note: This dictation was prepared with Dragon dictation along with smaller phrase technology. Any transcriptional errors that result from this process are unintentional.

## 2015-05-24 NOTE — Patient Instructions (Signed)
1. Resume activities as tolerated 2. May use hydrocortisone cream for incision sites itching 3. Return in 5 weeks for final postop check 4. May return to work

## 2015-06-28 ENCOUNTER — Ambulatory Visit: Payer: BLUE CROSS/BLUE SHIELD | Admitting: Obstetrics and Gynecology

## 2015-07-17 ENCOUNTER — Ambulatory Visit (INDEPENDENT_AMBULATORY_CARE_PROVIDER_SITE_OTHER): Payer: BLUE CROSS/BLUE SHIELD | Admitting: Obstetrics and Gynecology

## 2015-07-17 ENCOUNTER — Encounter: Payer: Self-pay | Admitting: Obstetrics and Gynecology

## 2015-07-17 VITALS — BP 123/83 | HR 76 | Ht 59.0 in | Wt 217.0 lb

## 2015-07-17 DIAGNOSIS — F419 Anxiety disorder, unspecified: Secondary | ICD-10-CM | POA: Insufficient documentation

## 2015-07-17 DIAGNOSIS — Z09 Encounter for follow-up examination after completed treatment for conditions other than malignant neoplasm: Secondary | ICD-10-CM

## 2015-07-17 DIAGNOSIS — Z90710 Acquired absence of both cervix and uterus: Secondary | ICD-10-CM

## 2015-07-17 DIAGNOSIS — R5383 Other fatigue: Secondary | ICD-10-CM | POA: Insufficient documentation

## 2015-07-17 NOTE — Progress Notes (Signed)
Chief complaint: 1. Final postop check 2. Status post LAVH bilateral salpingectomy and left oophorectomy  Patient presents for final postop check following surgery in February 2017. Bowel and bladder function are normal. No vaginal bleeding or discharge are noted. She is not experiencing any pelvic pain.  Major complaints today include some chronic fatigue and some increased anxiety. She is not experiencing any increased vasomotor symptoms. She does have long history of off again on-again anxiety for which was managed by her primary care.  Pathology from surgery was previously reviewed and notable for fibroids and hemorrhagic cyst of left ovary.  OBJECTIVE: BP 123/83 mmHg  Pulse 76  Ht 4\' 11"  (1.499 m)  Wt 217 lb (98.431 kg)  BMI 43.81 kg/m2  LMP 05/07/2015 (Approximate) Pleasant female in no acute distress; normal affect Abdomen: Soft, nontender; laparoscopy incisions 4 are well approximated and healed Pelvic: External genitalia normal BUS normal Vagina-good vault support; cuff is well-healed without evidence of granulation tissue Cervix-surgically absent Uterus-surgically absent Adnexa-nonpalpable and nontender Rectovaginal-normal external exam  ASSESSMENT: Final postop check-normal Chronic fatigue and anxiety  PLAN: 1. Resume all activities without restriction 2. Follow-up as needed 3. Return to see primary care if anxiety and fatigue persist 4. Consider multivitamin daily  Brayton Mars, MD  Note: This dictation was prepared with Dragon dictation along with smaller phrase technology. Any transcriptional errors that result from this process are unintentional.

## 2015-07-17 NOTE — Patient Instructions (Signed)
1. Resume all activities without restriction 2. Consider taking multivitamins daily 3. Fatigue and anxiety persist, return to primary care for follow-up 4. Return as needed for any new gynecologic issues

## 2016-04-30 ENCOUNTER — Telehealth: Payer: Self-pay

## 2016-04-30 NOTE — Telephone Encounter (Signed)
l mom to schedule NP appt per  Dr. Elyse Jarvis for CP

## 2016-05-06 NOTE — Telephone Encounter (Signed)
l MOM TO SCHEDULE NP APPT FOR CP

## 2016-05-08 NOTE — Telephone Encounter (Signed)
Informed [employer] of unable to contact pt

## 2016-05-08 NOTE — Telephone Encounter (Signed)
Unable to contact pt to schedule referral. Letter sent

## 2017-01-28 ENCOUNTER — Ambulatory Visit: Payer: BLUE CROSS/BLUE SHIELD | Attending: Neurology

## 2017-01-28 DIAGNOSIS — G4733 Obstructive sleep apnea (adult) (pediatric): Secondary | ICD-10-CM | POA: Insufficient documentation

## 2017-01-28 DIAGNOSIS — F5101 Primary insomnia: Secondary | ICD-10-CM | POA: Insufficient documentation

## 2017-03-17 ENCOUNTER — Ambulatory Visit: Payer: BLUE CROSS/BLUE SHIELD | Attending: Otolaryngology

## 2017-03-17 DIAGNOSIS — F5101 Primary insomnia: Secondary | ICD-10-CM | POA: Diagnosis not present

## 2017-03-17 DIAGNOSIS — G4733 Obstructive sleep apnea (adult) (pediatric): Secondary | ICD-10-CM | POA: Diagnosis not present

## 2017-09-28 ENCOUNTER — Emergency Department
Admission: EM | Admit: 2017-09-28 | Discharge: 2017-09-28 | Disposition: A | Discharge status: Home / Self Care | Payer: BLUE CROSS/BLUE SHIELD | Attending: Emergency Medicine | Admitting: Emergency Medicine

## 2017-09-28 ENCOUNTER — Other Ambulatory Visit: Payer: Self-pay

## 2017-09-28 ENCOUNTER — Emergency Department: Payer: BLUE CROSS/BLUE SHIELD

## 2017-09-28 ENCOUNTER — Encounter: Payer: Self-pay | Admitting: Emergency Medicine

## 2017-09-28 DIAGNOSIS — R002 Palpitations: Secondary | ICD-10-CM | POA: Diagnosis not present

## 2017-09-28 DIAGNOSIS — F17210 Nicotine dependence, cigarettes, uncomplicated: Secondary | ICD-10-CM | POA: Diagnosis not present

## 2017-09-28 DIAGNOSIS — I10 Essential (primary) hypertension: Secondary | ICD-10-CM | POA: Diagnosis not present

## 2017-09-28 DIAGNOSIS — R079 Chest pain, unspecified: Secondary | ICD-10-CM

## 2017-09-28 LAB — TROPONIN I: Troponin I: <0.03 ng/mL (ref <0.03)

## 2017-09-28 LAB — CBC
HEMATOCRIT: 40.5 % (ref 35.0–47.0)
Hemoglobin: 13.4 g/dL (ref 12.0–16.0)
MCH: 29.1 pg (ref 26.0–34.0)
MCHC: 33.2 g/dL (ref 32.0–36.0)
MCV: 87.9 fL (ref 80.0–100.0)
Platelets: 276 10*3/uL (ref 150–440)
RBC: 4.6 MIL/uL (ref 3.80–5.20)
RDW: 14.7 % — AB (ref 11.5–14.5)
WBC: 10.2 10*3/uL (ref 3.6–11.0)

## 2017-09-28 LAB — BRAIN NATRIURETIC PEPTIDE: B Natriuretic Peptide: 46 pg/mL (ref 0.0–100.0)

## 2017-09-28 LAB — T4, FREE: Free T4: 0.78 ng/dL — ABNORMAL LOW (ref 0.82–1.77)

## 2017-09-28 LAB — BASIC METABOLIC PANEL
Anion gap: 9 (ref 5–15)
BUN: 9 mg/dL (ref 6–20)
CHLORIDE: 102 mmol/L (ref 98–111)
CO2: 28 mmol/L (ref 22–32)
Calcium: 9 mg/dL (ref 8.9–10.3)
Creatinine, Ser: 0.73 mg/dL (ref 0.44–1.00)
GFR calc Af Amer: >60 mL/min (ref >60)
Glucose, Bld: 103 mg/dL — ABNORMAL HIGH (ref 70–99)
POTASSIUM: 3.9 mmol/L (ref 3.5–5.1)
SODIUM: 139 mmol/L (ref 135–145)

## 2017-09-28 LAB — TSH: TSH: 0.337 u[IU]/mL — AB (ref 0.350–4.500)

## 2017-09-28 MED ORDER — HYDROCODONE-ACETAMINOPHEN 5-325 MG PO TABS: 1.0000 | ORAL_TABLET | Freq: Three times a day (TID) | ORAL | 0 refills | Status: AC | PRN

## 2017-09-28 MED ORDER — PREDNISONE 50 MG PO TABS: ORAL_TABLET | ORAL | 0 refills | Status: AC

## 2017-09-28 MED ORDER — HYDROCODONE-ACETAMINOPHEN 5-325 MG PO TABS
2.0000 | ORAL_TABLET | Freq: Once | ORAL | Status: AC
  Administered 2017-09-28: 2 via ORAL
  Filled 2017-09-28: qty 2

## 2017-09-28 MED ORDER — OXYCODONE-ACETAMINOPHEN 5-325 MG PO TABS: 2.0000 | ORAL_TABLET | Freq: Once | ORAL | Status: DC

## 2017-09-28 NOTE — ED Provider Notes (Signed)
[facility] Emergency Department Provider Note       Time seen: ----------------------------------------- 4:19 PM on 09/28/2017 -----------------------------------------   I have reviewed the triage vital signs and the nursing notes.  HISTORY   Chief Complaint Chest Pain  HPI Deborah [name] is a 42 y.o. female with a history of anxiety, depression, GERD, hypertension, ovarian cysts, rheumatoid arthritis and fibroids who presents to the ED for on and off fluttering as well as palpitations for 2 months.  Over the past several days she began having pain between her shoulder blades and in front of her chest.  She describes it as an uncomfortable feeling that comes and goes.  She is been seen by her doctor and referred to cardiology.  She denies any recent illness or other complaints.  Past Medical History:  Diagnosis Date  . Anxiety   . Broken foot 2010  . Complication of anesthesia   . Depression   . GERD (gastroesophageal reflux disease)   . Hypertension   . Ovarian cyst   . Ovarian cyst   . PONV (postoperative nausea and vomiting)   . Uterine fibroid     Patient Active Problem List   Diagnosis Date Noted  . Anxiety 07/17/2015  . Other fatigue 07/17/2015  . Status post laparoscopic assisted vaginal hysterectomy 05/24/2015  . Chronic pelvic pain in female 04/11/2015  . Status post tubal ligation 04/11/2015  . Status post endometrial ablation 04/11/2015  . Dysmenorrhea 04/11/2015  . BP (high blood pressure) 01/08/2015  . Borderline diabetes mellitus 01/08/2015    Past Surgical History:  Procedure Laterality Date  . EYE SURGERY     tear duck  . FOOT SURGERY Left   . HERNIA REPAIR    . LAPAROSCOPIC VAGINAL HYSTERECTOMY WITH SALPINGO OOPHORECTOMY Bilateral 05/14/2015   Procedure: LAPAROSCOPIC ASSISTED VAGINAL HYSTERECTOMY WITH LEFT OOPHERECTOMY, BILATERAL SALPINGECTOMY;  Surgeon: Brayton Mars, MD;  Location: [facility] ORS;  Service: Gynecology;   Laterality: Bilateral;  . [place]  2007  . TUBAL LIGATION  2000    Allergies Patient has no known allergies.  Social History Social History   Tobacco Use  . Smoking status: Current Every Day Smoker    Packs/day: 1.00    Years: 20.00    Pack years: 20.00    Types: Cigarettes  Substance Use Topics  . Alcohol use: Yes    Comment: occ. couple a week  . Drug use: No   Review of Systems Constitutional: Negative for fever. Cardiovascular: Positive for chest pain Respiratory: Negative for shortness of breath. Gastrointestinal: Negative for abdominal pain, vomiting and diarrhea. Musculoskeletal: Negative for back pain. Skin: Negative for rash. Neurological: Negative for headaches, focal weakness or numbness.  All systems negative/normal/unremarkable except as stated in the HPI  ____________________________________________   PHYSICAL EXAM:  VITAL SIGNS: ED Triage Vitals  Enc Vitals Group     BP 09/28/17 1507 (!) 161/96     Pulse Rate 09/28/17 1507 72     Resp 09/28/17 1507 18     Temp 09/28/17 1507 98.5 F (36.9 C)     Temp Source 09/28/17 1507 Oral     SpO2 09/28/17 1507 100 %     Weight 09/28/17 1511 230 lb (104.3 kg)     Height 09/28/17 1511 4\' 11"  (1.499 m)     Head Circumference --      Peak Flow --      Pain Score 09/28/17 1510 4     Pain Loc --  Pain Edu? --      Excl. in [place]? --    Constitutional: Alert and oriented. Well appearing and in no distress. Eyes: Conjunctivae are normal. Normal extraocular movements. ENT   Head: Normocephalic and atraumatic.   Nose: No congestion/rhinnorhea.   Mouth/Throat: Mucous membranes are moist.   Neck: No stridor. Cardiovascular: Normal rate, regular rhythm.  Palpitations are noted Respiratory: Normal respiratory effort without tachypnea nor retractions. Breath sounds are clear and equal bilaterally. No wheezes/rales/rhonchi. Gastrointestinal: Soft and nontender. Normal bowel  sounds Musculoskeletal: Nontender with normal range of motion in extremities. No lower extremity tenderness nor edema. Neurologic:  Normal speech and language. No gross focal neurologic deficits are appreciated.  Skin:  Skin is warm, dry and intact. No rash noted. Psychiatric: Mood and affect are normal. Speech and behavior are normal.  ____________________________________________  EKG: Interpreted by me.  Sinus rhythm with a rate of 91 bpm, frequent PVCs are noted, normal axis, normal QT  ____________________________________________  ED COURSE:  As part of my medical decision making, I reviewed the following data within the [place] History obtained from family if available, nursing notes, old chart and ekg, as well as notes from prior ED visits. Patient presented for palpitations and chest pain, we will assess with labs and imaging as indicated at this time.   Procedures ____________________________________________   LABS (pertinent positives/negatives)  Labs Reviewed  BASIC METABOLIC PANEL - Abnormal; Notable for the following components:      Result Value   Glucose, Bld 103 (*)    All other components within normal limits  CBC - Abnormal; Notable for the following components:   RDW 14.7 (*)    All other components within normal limits  TSH - Abnormal; Notable for the following components:   TSH 0.337 (*)    All other components within normal limits  T4, FREE - Abnormal; Notable for the following components:   Free T4 0.78 (*)    All other components within normal limits  TROPONIN I  BRAIN NATRIURETIC PEPTIDE    RADIOLOGY Images were viewed by me  Chest x-ray IMPRESSION: Mildly increased interstitial markings bilaterally may reflect chronic bronchitic-smoking related changes or could reflect acute mild interstitial pneumonia or noncardiac pulmonary edema. Followup PA and lateral chest X-ray is recommended in 3-4 weeks following trial of antibiotic  therapy to ensure resolution and exclude underlying malignancy. ____________________________________________  DIFFERENTIAL DIAGNOSIS   Musculoskeletal pain, rheumatoid arthritis, unstable angina, PE, dissection, muscle strain  FINAL ASSESSMENT AND PLAN  Chest pain   Plan: The patient had presented for chest pain and palpitations. Patient's labs did not reveal any acute process although she is borderline hypothyroid. Patient's imaging is nonspecific and she does not have any respiratory symptoms.  It is unclear the significance of these findings.  She has no other findings consistent with CHF.  Will prescribe prednisone as well as some generic pain medicine for her, I think her chest pain is likely related to rheumatoid arthritis.   Laurence Aly, MD   Note: This note was generated in part or whole with voice recognition software. Voice recognition is usually quite accurate but there are transcription errors that can and very often do occur. I apologize for any typographical errors that were not detected and corrected.     Earleen Newport, MD 09/28/17 [phone]

## 2017-09-28 NOTE — ED Triage Notes (Signed)
Patient reports having palpitations and discomfort in chest since this weekend. Patient reports that feeling comes and goes but she also get nauseous with the discomfort. History of HTN. Denies cardiac history.

## 2017-09-28 NOTE — ED Notes (Signed)
Lab contacted to draw blood.

## 2017-09-28 NOTE — ED Notes (Signed)
Pt reports having on/off "fluttering" and "palpitations" x approx 2 months. Pt reports constant pain between shoulder blades and intermittent chest pressure. States "I am very uncomfortable."

## 2017-11-20 ENCOUNTER — Encounter

## 2017-11-20 ENCOUNTER — Ambulatory Visit: Payer: BLUE CROSS/BLUE SHIELD | Admitting: Cardiovascular Disease

## 2019-05-12 ENCOUNTER — Ambulatory Visit: Payer: Self-pay | Attending: Internal Medicine

## 2019-05-12 DIAGNOSIS — Z23 Encounter for immunization: Secondary | ICD-10-CM

## 2019-05-12 NOTE — Progress Notes (Signed)
   Covid-19 Vaccination Clinic  Name:  Deborah [name]    MRN: [id number] DOB: Dec 12, 1975  05/12/2019  [name] was observed post Covid-19 immunization for 15 minutes without incident. She was provided with Vaccine Information Sheet and instruction to access the V-Safe system.   [name] was instructed to call 911 with any severe reactions post vaccine: Marland Kitchen Difficulty breathing  . Swelling of face and throat  . A fast heartbeat  . A bad rash all over body  . Dizziness and weakness   Immunizations Administered    Name Date Dose VIS Date Route   Pfizer COVID-19 Vaccine 05/12/2019  9:28 AM 0.3 mL 02/04/2019 Intramuscular   Manufacturer: [place]   Lot: [id number]   [place]: [id number]

## 2019-06-08 ENCOUNTER — Ambulatory Visit: Payer: Self-pay | Attending: Internal Medicine

## 2019-06-08 DIAGNOSIS — Z23 Encounter for immunization: Secondary | ICD-10-CM

## 2019-06-08 NOTE — Progress Notes (Signed)
   Covid-19 Vaccination Clinic  Name:  Deborah [name]    MRN: [id number] DOB: Feb 01, 1976  06/08/2019  Deborah [name] was observed post Covid-19 immunization for 15 minutes without incident. She was provided with Vaccine Information Sheet and instruction to access the V-Safe system.   Deborah [name] was instructed to call 911 with any severe reactions post vaccine: Marland Kitchen Difficulty breathing  . Swelling of face and throat  . A fast heartbeat  . A bad rash all over body  . Dizziness and weakness   Immunizations Administered    Name Date Dose VIS Date Route   Pfizer COVID-19 Vaccine 06/08/2019  8:19 AM 0.3 mL 02/04/2019 Intramuscular   Manufacturer: [place]   Lot: [id number]   [place]: [id number]

## 2019-06-27 ENCOUNTER — Other Ambulatory Visit: Payer: Self-pay | Admitting: Sports Medicine

## 2019-06-27 DIAGNOSIS — M7501 Adhesive capsulitis of right shoulder: Secondary | ICD-10-CM

## 2019-06-27 DIAGNOSIS — G8929 Other chronic pain: Secondary | ICD-10-CM

## 2019-06-27 DIAGNOSIS — M7541 Impingement syndrome of right shoulder: Secondary | ICD-10-CM

## 2019-06-27 DIAGNOSIS — M24011 Loose body in right shoulder: Secondary | ICD-10-CM

## 2019-06-27 DIAGNOSIS — M7551 Bursitis of right shoulder: Secondary | ICD-10-CM

## 2019-07-05 ENCOUNTER — Ambulatory Visit
Admission: RE | Admit: 2019-07-05 | Discharge: 2019-07-05 | Disposition: A | Discharge status: Home / Self Care | Payer: 59 | Source: Ambulatory Visit | Attending: Sports Medicine | Admitting: Sports Medicine

## 2019-07-05 ENCOUNTER — Other Ambulatory Visit: Payer: Self-pay

## 2019-07-05 DIAGNOSIS — G8929 Other chronic pain: Secondary | ICD-10-CM | POA: Diagnosis present

## 2019-07-05 DIAGNOSIS — M24011 Loose body in right shoulder: Secondary | ICD-10-CM | POA: Diagnosis present

## 2019-07-05 DIAGNOSIS — M7541 Impingement syndrome of right shoulder: Secondary | ICD-10-CM

## 2019-07-05 DIAGNOSIS — M7551 Bursitis of right shoulder: Secondary | ICD-10-CM | POA: Diagnosis present

## 2019-07-05 DIAGNOSIS — M7501 Adhesive capsulitis of right shoulder: Secondary | ICD-10-CM | POA: Diagnosis present

## 2019-07-05 DIAGNOSIS — M25511 Pain in right shoulder: Secondary | ICD-10-CM | POA: Insufficient documentation

## 2020-04-20 ENCOUNTER — Encounter: Payer: Self-pay | Admitting: Emergency Medicine

## 2020-04-20 ENCOUNTER — Other Ambulatory Visit: Payer: Self-pay

## 2020-04-20 ENCOUNTER — Emergency Department
Admission: EM | Admit: 2020-04-20 | Discharge: 2020-04-20 | Disposition: A | Discharge status: Home / Self Care | Payer: No Typology Code available for payment source | Attending: Student in an Organized Health Care Education/Training Program | Admitting: Student in an Organized Health Care Education/Training Program

## 2020-04-20 ENCOUNTER — Emergency Department: Payer: No Typology Code available for payment source

## 2020-04-20 DIAGNOSIS — F17210 Nicotine dependence, cigarettes, uncomplicated: Secondary | ICD-10-CM | POA: Diagnosis not present

## 2020-04-20 DIAGNOSIS — Z79899 Other long term (current) drug therapy: Secondary | ICD-10-CM | POA: Insufficient documentation

## 2020-04-20 DIAGNOSIS — I10 Essential (primary) hypertension: Secondary | ICD-10-CM | POA: Diagnosis not present

## 2020-04-20 DIAGNOSIS — R0789 Other chest pain: Secondary | ICD-10-CM

## 2020-04-20 DIAGNOSIS — R030 Elevated blood-pressure reading, without diagnosis of hypertension: Secondary | ICD-10-CM | POA: Diagnosis present

## 2020-04-20 LAB — BASIC METABOLIC PANEL
Anion gap: 8 (ref 5–15)
BUN: 14 mg/dL (ref 6–20)
CO2: 27 mmol/L (ref 22–32)
Calcium: 9.2 mg/dL (ref 8.9–10.3)
Chloride: 104 mmol/L (ref 98–111)
Creatinine, Ser: 0.76 mg/dL (ref 0.44–1.00)
GFR, Estimated: >60 mL/min (ref >60)
Glucose, Bld: 116 mg/dL — ABNORMAL HIGH (ref 70–99)
Potassium: 3.8 mmol/L (ref 3.5–5.1)
Sodium: 139 mmol/L (ref 135–145)

## 2020-04-20 LAB — CBC
HCT: 41 % (ref 36.0–46.0)
Hemoglobin: 13.8 g/dL (ref 12.0–15.0)
MCH: 29.4 pg (ref 26.0–34.0)
MCHC: 33.7 g/dL (ref 30.0–36.0)
MCV: 87.4 fL (ref 80.0–100.0)
Platelets: 295 10*3/uL (ref 150–400)
RBC: 4.69 MIL/uL (ref 3.87–5.11)
RDW: 15 % (ref 11.5–15.5)
WBC: 10.7 10*3/uL — ABNORMAL HIGH (ref 4.0–10.5)
nRBC: 0 % (ref 0.0–0.2)

## 2020-04-20 LAB — TROPONIN I (HIGH SENSITIVITY)
Troponin I (High Sensitivity): 3 ng/L (ref <18)
Troponin I (High Sensitivity): 4 ng/L (ref <18)

## 2020-04-20 MED ORDER — OMEPRAZOLE 20 MG PO CPDR: 20.0000 mg | DELAYED_RELEASE_CAPSULE | ORAL | 1 refills | Status: AC

## 2020-04-20 MED ORDER — AMLODIPINE BESYLATE 5 MG PO TABS
5.0000 mg | ORAL_TABLET | Freq: Every day | ORAL | 0 refills | Status: AC
Start: 2020-04-20 — End: 2021-04-20

## 2020-04-20 MED ORDER — AMLODIPINE BESYLATE 5 MG PO TABS
5.0000 mg | ORAL_TABLET | Freq: Once | ORAL | Status: AC
  Administered 2020-04-20: 5 mg via ORAL
  Filled 2020-04-20: qty 1

## 2020-04-20 NOTE — ED Triage Notes (Signed)
Pt to ED via POV stating that yesterday she checked her blood pressure and it was high. Pt states that this is the first she has noticed it. Pt states that she had a headache which is why she checked her BP. Pt has hx/o HTN, pt states that she takes lisinopril and has not missed any doses. Pt reports intermittent pain in her chest. Pt states that she is having some chest pain now but that it is not severe. Pt is in NAD.

## 2020-04-20 NOTE — ED Provider Notes (Signed)
[facility] Emergency Department Provider Note    Event Date/Time   First MD Initiated Contact with Patient 04/20/20 1827     (approximate)  I have reviewed the triage vital signs and the nursing notes.   HISTORY  Chief Complaint Hypertension    HPI Deborah [name] is a 45 y.o. female   below listed past medical history presents to the ER for intermittent chest discomfort points to her left anterior chest states it is brief in nature feels like it is always there.  Will have brief episodes where it is feels like a sharp spasm.  No radiation pain through to her back.  No nausea or vomiting.  Also has had mild headache is worried because she checked her blood pressure and it was elevated.  She is been taking lisinopril.  Has not missed any doses.  She went to urgent care and was directed to the ER for further evaluation.   Past Medical History:  Diagnosis Date  . Anxiety   . Broken foot 2010  . Complication of anesthesia   . Depression   . GERD (gastroesophageal reflux disease)   . Hypertension   . Ovarian cyst   . Ovarian cyst   . PONV (postoperative nausea and vomiting)   . Uterine fibroid    Family History  Problem Relation Age of Onset  . Hypertension Mother   . Hypertension Father   . Diabetes Sister   . Migraines Paternal Grandmother   . Cancer Neg Hx   . Heart disease Neg Hx    Past Surgical History:  Procedure Laterality Date  . EYE SURGERY     tear duck  . FOOT SURGERY Left   . HERNIA REPAIR    . LAPAROSCOPIC VAGINAL HYSTERECTOMY WITH SALPINGO OOPHORECTOMY Bilateral 05/14/2015   Procedure: LAPAROSCOPIC ASSISTED VAGINAL HYSTERECTOMY WITH LEFT OOPHERECTOMY, BILATERAL SALPINGECTOMY;  Surgeon: Brayton Mars, MD;  Location: [facility] ORS;  Service: Gynecology;  Laterality: Bilateral;  . [place]  2007  . TUBAL LIGATION  2000   Patient Active Problem List   Diagnosis Date Noted  . Anxiety 07/17/2015  . Other fatigue  07/17/2015  . Status post laparoscopic assisted vaginal hysterectomy 05/24/2015  . Chronic pelvic pain in female 04/11/2015  . Status post tubal ligation 04/11/2015  . Status post endometrial ablation 04/11/2015  . Dysmenorrhea 04/11/2015  . BP (high blood pressure) 01/08/2015  . Borderline diabetes mellitus 01/08/2015      Prior to Admission medications   Medication Sig Start Date End Date Taking? Authorizing Provider  amLODipine (NORVASC) 5 MG tablet Take 1 tablet (5 mg total) by mouth daily. 04/20/20 04/20/21 Yes Merlyn Lot, MD  HYDROcodone-acetaminophen (NORCO/VICODIN) 5-325 MG tablet Take 1 tablet by mouth 3 (three) times daily as needed for moderate pain. 09/28/17   Earleen Newport, MD  lisinopril (PRINIVIL,ZESTRIL) 10 MG tablet Take 1 tablet by mouth every morning.  09/20/13   [provider]  omeprazole (PRILOSEC) 20 MG capsule Take 1 capsule (20 mg total) by mouth every morning. 04/20/20   Merlyn Lot, MD  predniSONE (DELTASONE) 50 MG tablet Take 1 tablet by mouth daily 09/28/17   Earleen Newport, MD    Allergies Patient has no known allergies.    Social History Social History   Tobacco Use  . Smoking status: Current Every Day Smoker    Packs/day: 1.00    Years: 20.00    Pack years: 20.00    Types: Cigarettes  Substance Use Topics  .  Alcohol use: Yes    Comment: occ. couple a week  . Drug use: No    Review of Systems Patient denies headaches, rhinorrhea, blurry vision, numbness, shortness of breath, chest pain, edema, cough, abdominal pain, nausea, vomiting, diarrhea, dysuria, fevers, rashes or hallucinations unless otherwise stated above in HPI. ____________________________________________   PHYSICAL EXAM:  VITAL SIGNS: Vitals:   04/20/20 1606 04/20/20 1837  BP:  (!) 141/77  Pulse:  82  Resp:  17  Temp:    SpO2: 98% 98%    Constitutional: Alert and oriented.  Eyes: Conjunctivae are normal.  Head: Atraumatic. Nose: No  congestion/rhinnorhea. Mouth/Throat: Mucous membranes are moist.   Neck: No stridor. Painless ROM.  Cardiovascular: Normal rate, regular rhythm. Grossly normal heart sounds.  Good peripheral circulation. Respiratory: Normal respiratory effort.  No retractions. Lungs CTAB. Gastrointestinal: Soft and nontender. No distention. No abdominal bruits. No CVA tenderness. Genitourinary:  Musculoskeletal: No lower extremity tenderness nor edema.  No joint effusions. Neurologic:  CN- intact.  No facial droop, Normal FNF.  Normal heel to shin.  Sensation intact bilaterally. Normal speech and language. No gross focal neurologic deficits are appreciated. No gait instability. Skin:  Skin is warm, dry and intact. No rash noted. Psychiatric: Mood and affect are normal. Speech and behavior are normal.  ____________________________________________   LABS (all labs ordered are listed, but only abnormal results are displayed)  Results for orders placed or performed during the hospital encounter of 04/20/20 (from the past 24 hour(s))  Basic metabolic panel     Status: Abnormal   Collection Time: 04/20/20  4:04 PM  Result Value Ref Range   Sodium 139 135 - 145 mmol/L   Potassium 3.8 3.5 - 5.1 mmol/L   Chloride 104 98 - 111 mmol/L   CO2 27 22 - 32 mmol/L   Glucose, Bld 116 (H) 70 - 99 mg/dL   BUN 14 6 - 20 mg/dL   Creatinine, Ser 0.76 0.44 - 1.00 mg/dL   Calcium 9.2 8.9 - 10.3 mg/dL   GFR, Estimated >60 >60 mL/min   Anion gap 8 5 - 15  CBC     Status: Abnormal   Collection Time: 04/20/20  4:04 PM  Result Value Ref Range   WBC 10.7 (H) 4.0 - 10.5 K/uL   RBC 4.69 3.87 - 5.11 MIL/uL   Hemoglobin 13.8 12.0 - 15.0 g/dL   HCT 41.0 36.0 - 46.0 %   MCV 87.4 80.0 - 100.0 fL   MCH 29.4 26.0 - 34.0 pg   MCHC 33.7 30.0 - 36.0 g/dL   RDW 15.0 11.5 - 15.5 %   Platelets 295 150 - 400 K/uL   nRBC 0.0 0.0 - 0.2 %  Troponin I (High Sensitivity)     Status: None   Collection Time: 04/20/20  4:04 PM  Result Value  Ref Range   Troponin I (High Sensitivity) 3 <18 ng/L  Troponin I (High Sensitivity)     Status: None   Collection Time: 04/20/20  6:43 PM  Result Value Ref Range   Troponin I (High Sensitivity) 4 <18 ng/L   ____________________________________________  EKG My review and personal interpretation at Time: 15:49   Indication: htn  Rate: 70  Rhythm: sinus Axis: normal Other: normal intervals, no stemi ____________________________________________  RADIOLOGY  I personally reviewed all radiographic images ordered to evaluate for the above acute complaints and reviewed radiology reports and findings.  These findings were personally discussed with the patient.  Please see medical record for radiology  report.  ____________________________________________   PROCEDURES  Procedure(s) performed:  Procedures    Critical Care performed: no ____________________________________________   INITIAL IMPRESSION / ASSESSMENT AND PLAN / ED COURSE  Pertinent labs & imaging results that were available during my care of the patient were reviewed by me and considered in my medical decision making (see chart for details).   DDX: Hypertension, hypertensive urgency, ACS, CAD, CHF, musculoskeletal strain, pneumothorax, PE, fibromyalgia  Deborah [name] is a 45 y.o. who presents to the ED with presentation as described above.  Patient mildly hypertensive but very well-appearing in no acute distress.  Atypical chest pain seems noncardiac in etiology.  Mild hypertensive but EKG is nonischemic serial troponins are negative.  She is low risk by heart score does not describe active chest pain.  Seems very musculoskeletal in nature.  Not consistent with dissection or PE.  Blood work otherwise reassuring  Clinical Course as of 04/20/20 1924  Fri Apr 20, 2020  1919 Patient reassessed.  Repeat troponin negative.  She is pain-free.  She is little hypertensive will order some low-dose Norvasc patient will follow up with  primary care doctor.  She is also asked for refill on omeprazole.  No abdominal pain.  Have discussed with the patient and available family all diagnostics and treatments performed thus far and all questions were answered to the best of my ability. The patient demonstrates understanding and agreement with plan.   [PR]    Clinical Course User Index [PR] Merlyn Lot, MD    The patient was evaluated in Emergency Department today for the symptoms described in the history of present illness. He/she was evaluated in the context of the global COVID-19 pandemic, which necessitated consideration that the patient might be at risk for infection with the SARS-CoV-2 virus that causes COVID-19. Institutional protocols and algorithms that pertain to the evaluation of patients at risk for COVID-19 are in a state of rapid change based on information released by regulatory bodies including the CDC and federal and state organizations. These policies and algorithms were followed during the patient's care in the ED.  As part of my medical decision making, I reviewed the following data within the [place] notes reviewed and incorporated, Labs reviewed, notes from prior ED visits and [place] Controlled Substance Database   ____________________________________________   FINAL CLINICAL IMPRESSION(S) / ED DIAGNOSES  Final diagnoses:  Hypertension, unspecified type  Atypical chest pain      NEW MEDICATIONS STARTED DURING THIS VISIT:  New Prescriptions   AMLODIPINE (NORVASC) 5 MG TABLET    Take 1 tablet (5 mg total) by mouth daily.     Note:  This document was prepared using Dragon voice recognition software and may include unintentional dictation errors.    Merlyn Lot, MD 04/20/20 [id number]

## 2020-05-18 ENCOUNTER — Other Ambulatory Visit: Payer: Self-pay | Admitting: Pulmonary Disease

## 2020-05-18 ENCOUNTER — Other Ambulatory Visit (HOSPITAL_COMMUNITY): Payer: Self-pay | Admitting: Pulmonary Disease

## 2020-05-18 DIAGNOSIS — J849 Interstitial pulmonary disease, unspecified: Secondary | ICD-10-CM

## 2020-05-18 DIAGNOSIS — R0602 Shortness of breath: Secondary | ICD-10-CM

## 2020-05-31 ENCOUNTER — Ambulatory Visit: Payer: No Typology Code available for payment source

## 2020-06-21 ENCOUNTER — Other Ambulatory Visit: Payer: Self-pay

## 2020-06-21 ENCOUNTER — Ambulatory Visit
Admission: RE | Admit: 2020-06-21 | Discharge: 2020-06-21 | Disposition: A | Discharge status: Home / Self Care | Payer: No Typology Code available for payment source | Source: Ambulatory Visit | Attending: Pulmonary Disease | Admitting: Pulmonary Disease

## 2020-06-21 DIAGNOSIS — J849 Interstitial pulmonary disease, unspecified: Secondary | ICD-10-CM | POA: Insufficient documentation

## 2020-06-21 DIAGNOSIS — R0602 Shortness of breath: Secondary | ICD-10-CM | POA: Diagnosis present

## 2021-10-06 IMAGING — CR DG CHEST 2V
1 series · 2 of 2 positions shown · non-contrast
Comparison: Radiographs 09/28/2017.

CLINICAL DATA: Chest pain.  Elevated blood pressure.

EXAM:
CHEST - 2 VIEW

[Series 1: dg chest 2 view · 0.14mm/px · 2 of 2 slices shown]
[im 1/2]
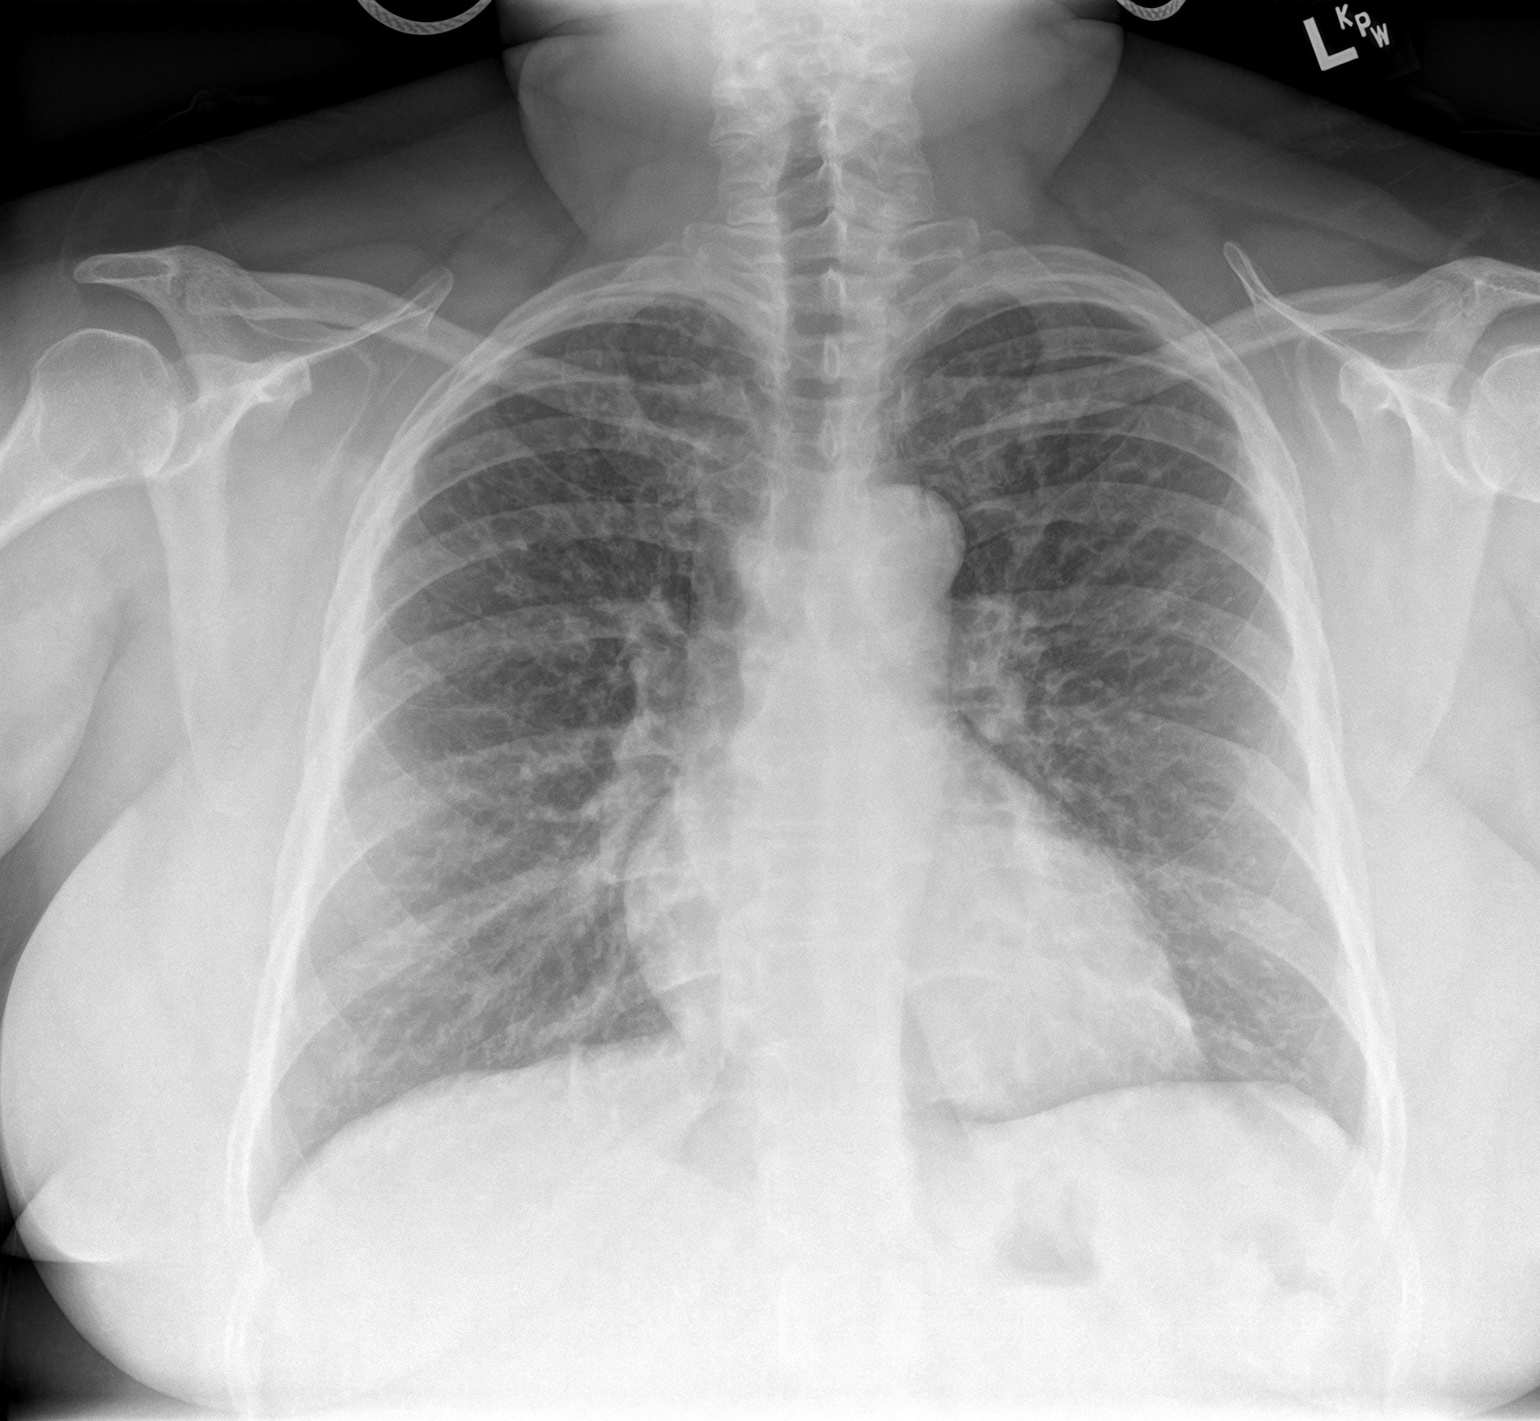
[im 2/2]
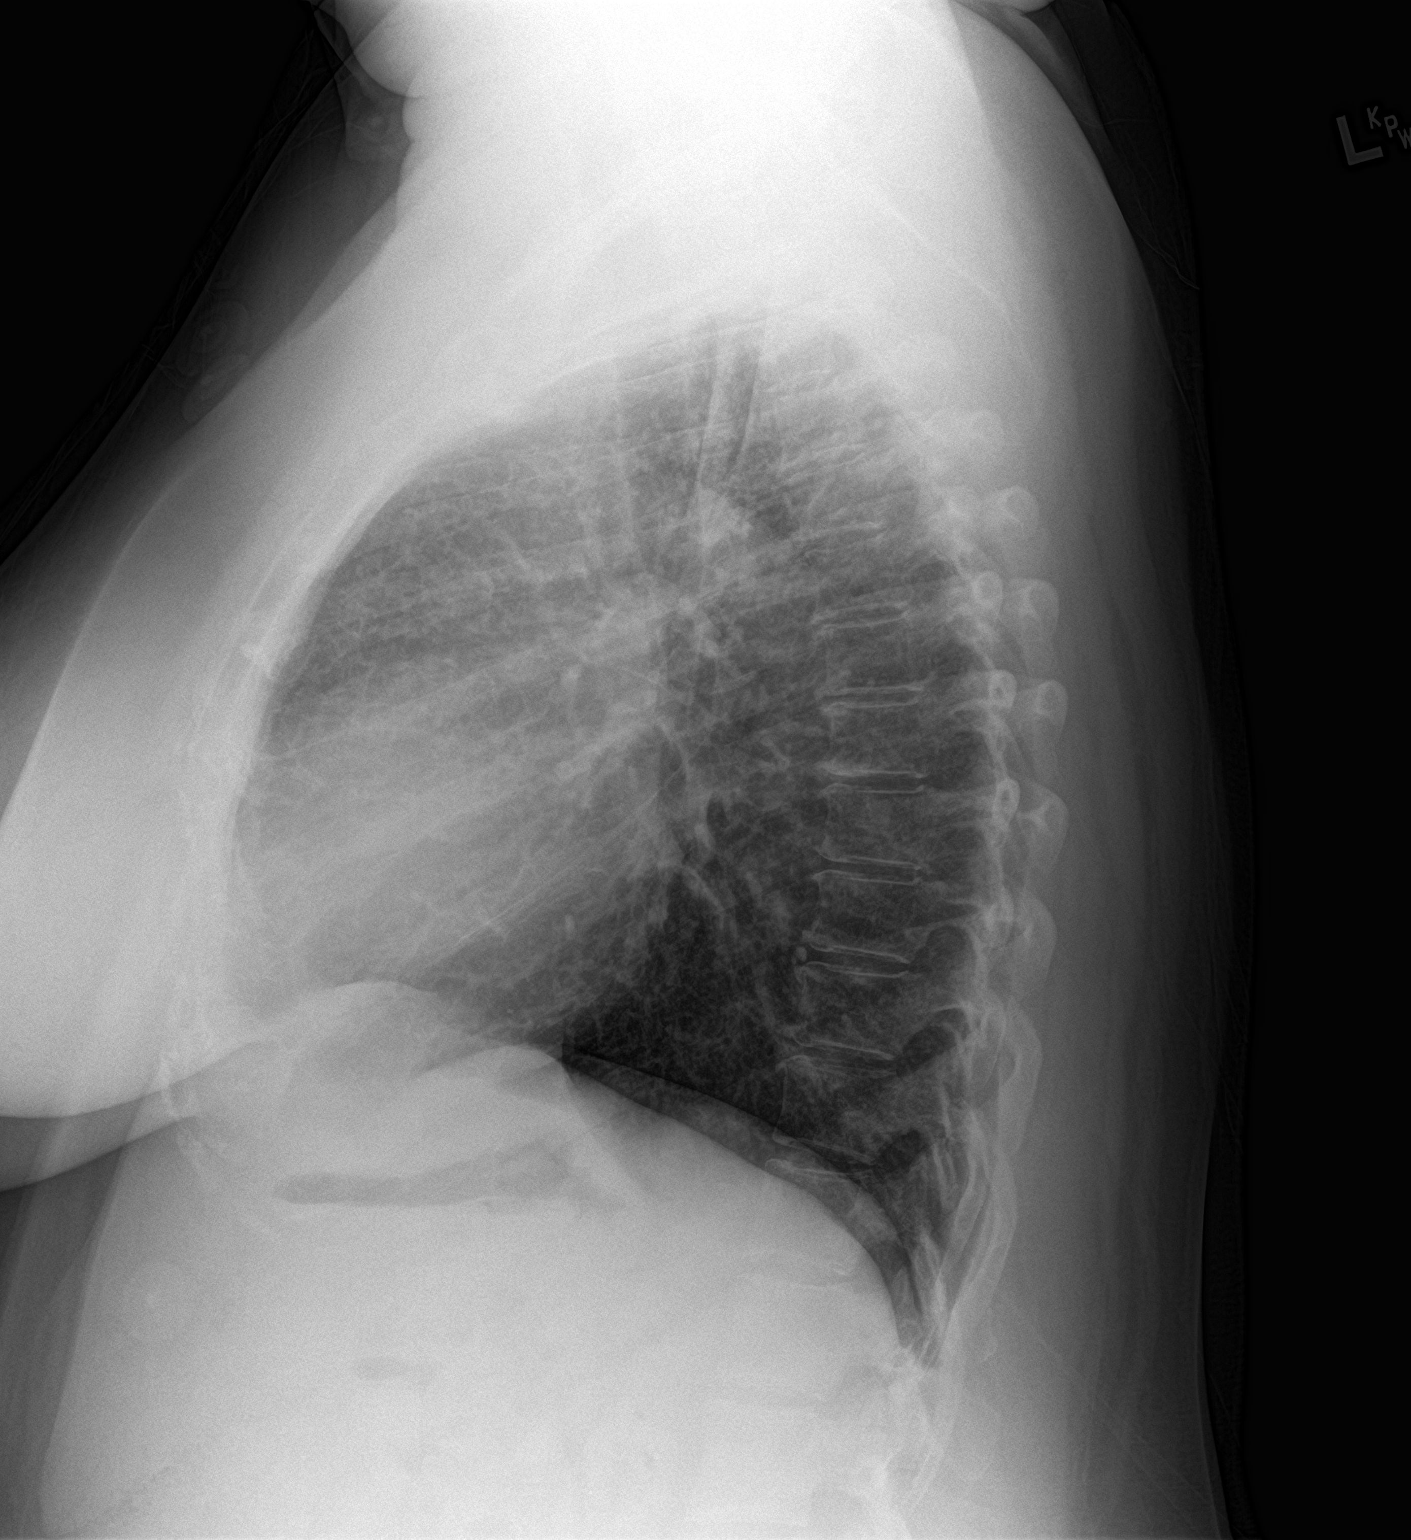

[2 of 2 positions shown; findings below may reference images not displayed]

FINDINGS: The heart size and mediastinal contours are stable. The lungs appear
stable with diffuse interstitial prominence consistent with chronic
interstitial lung disease. There is no superimposed airspace
disease, edema, pleural effusion or pneumothorax. The bones appear
unremarkable.
IMPRESSION: Chronic interstitial lung disease without evidence of acute
superimposed process. If the etiology for this lung disease is
unknown, consider further evaluation with outpatient high-resolution
chest CT.

## 2021-12-07 IMAGING — CT CT CHEST HIGH RESOLUTION W/O CM
2 of 7 series · 14 of 36 positions shown, 17 images · non-contrast
Comparison: 04/20/2020 chest radiograph.

CLINICAL DATA: Rheumatoid arthritis. Intermittent dyspnea. Current
smoker. Abnormal chest radiograph.

EXAM:
CT CHEST WITHOUT CONTRAST
TECHNIQUE: Multidetector CT imaging of the chest was performed following the
standard protocol without intravenous contrast. High resolution
imaging of the lungs, as well as inspiratory and expiratory imaging,
was performed.

[Series 4: thorax 2.00 cor · coronal · 0.60mm/px · 3 of 142 slices shown]
[im 29/142  lung]
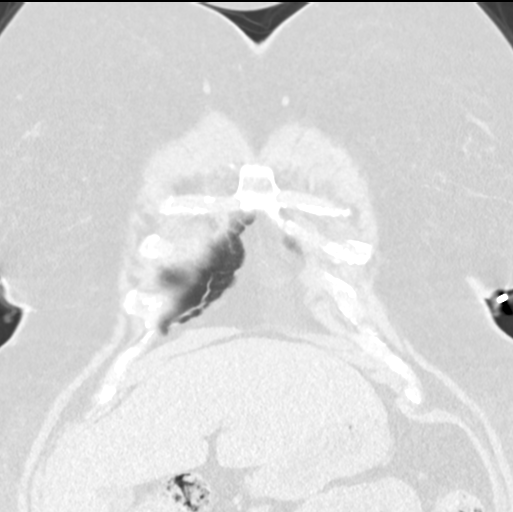
[im 57/142  lung]
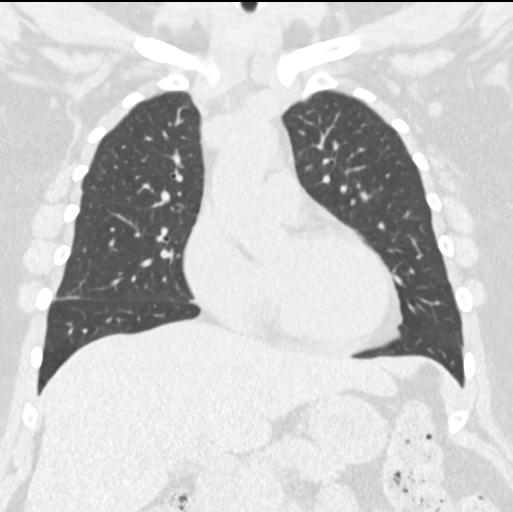
[im 85/142  lung]
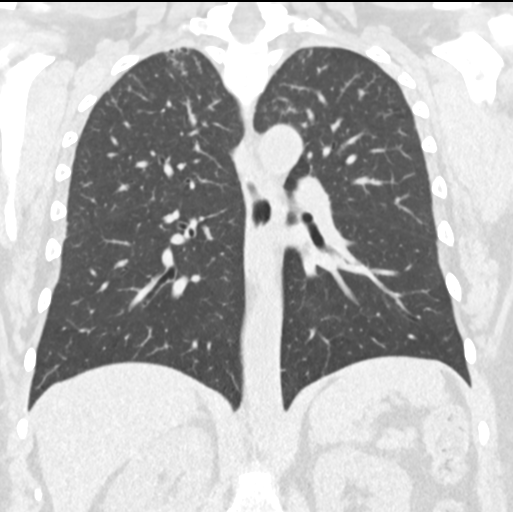

[Series 11: high res (id) thorax 1.00 ax · axial · 0.56mm/px · z∈[-1159,-903]mm · 11 of 304 slices shown, 14 images]
[im 24/304  mediastinal]
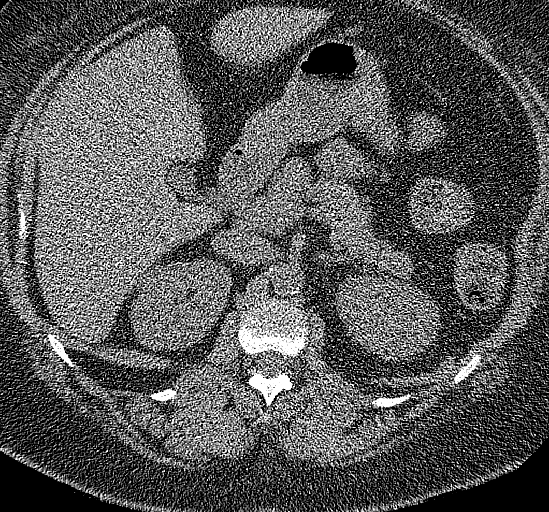
[im 24/304  lung]
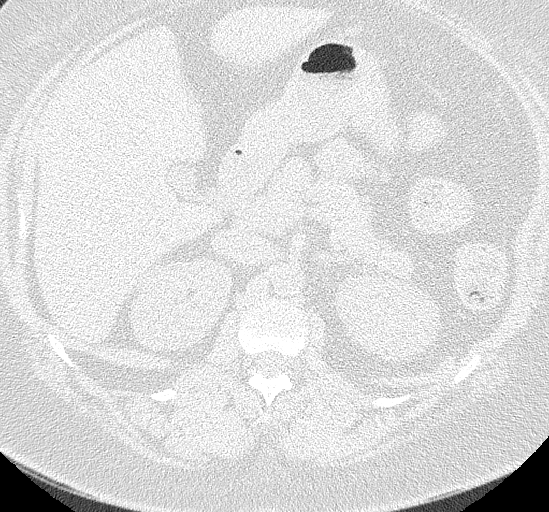
[im 47/304  lung]
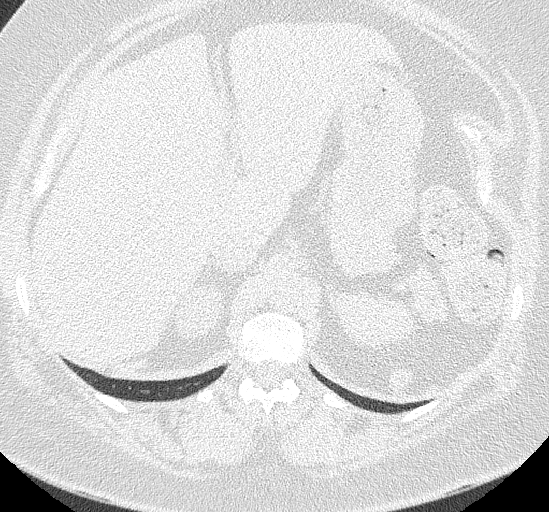
[im 70/304  lung]
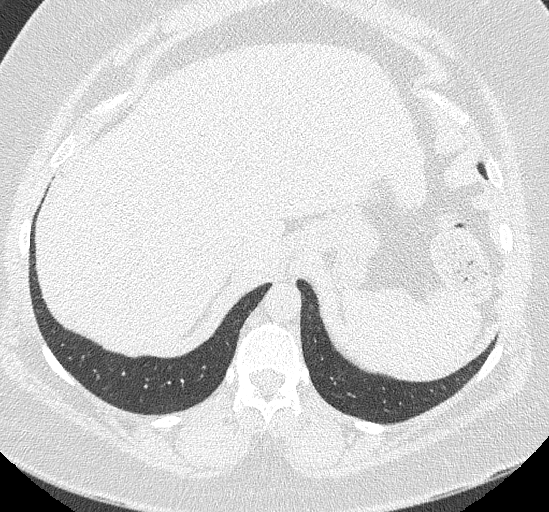
[im 94/304  lung]
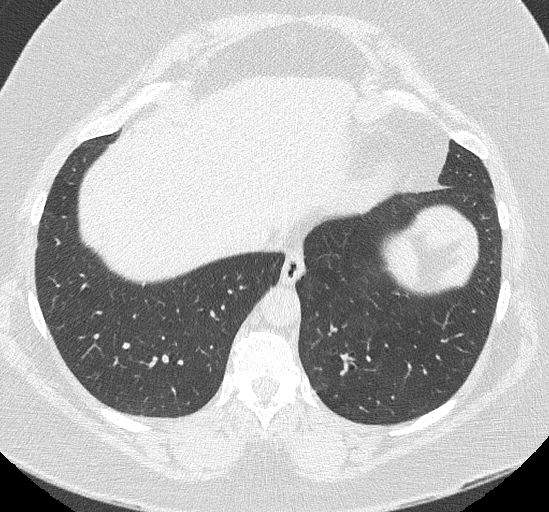
[im 117/304  mediastinal]
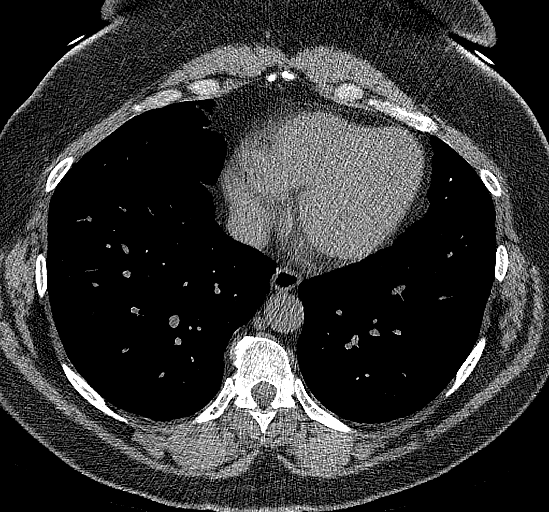
[im 117/304  lung]
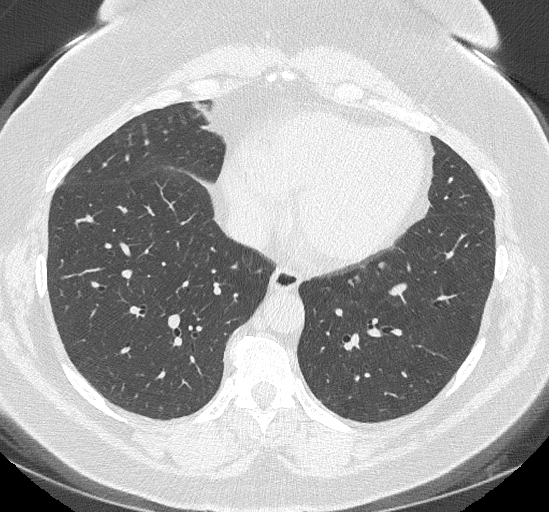
[im 164/304  lung]
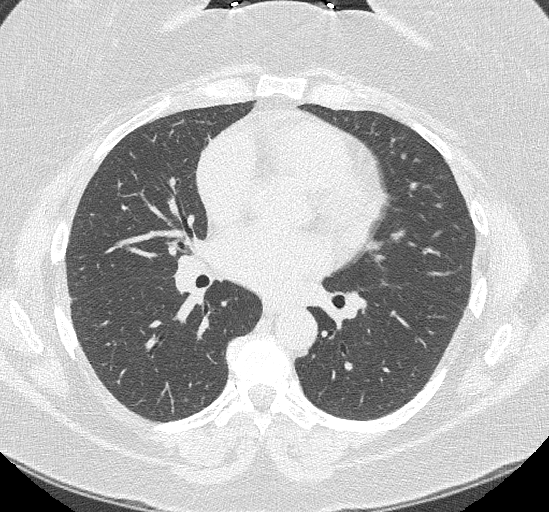
[im 187/304  lung]
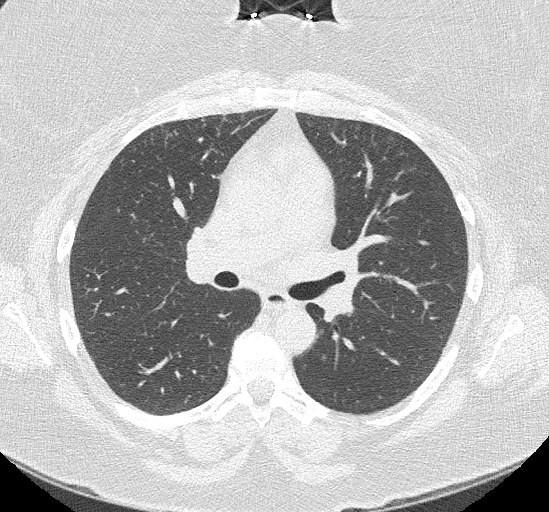
[im 210/304  lung]
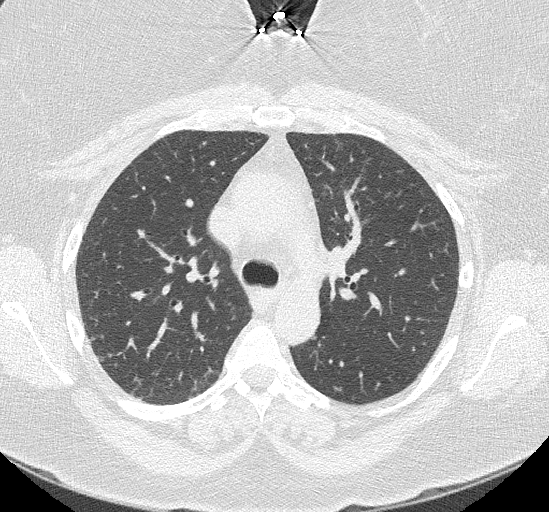
[im 234/304  mediastinal]
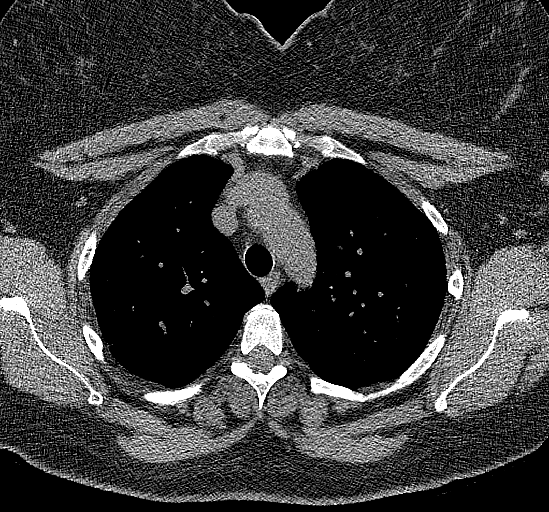
[im 234/304  lung]
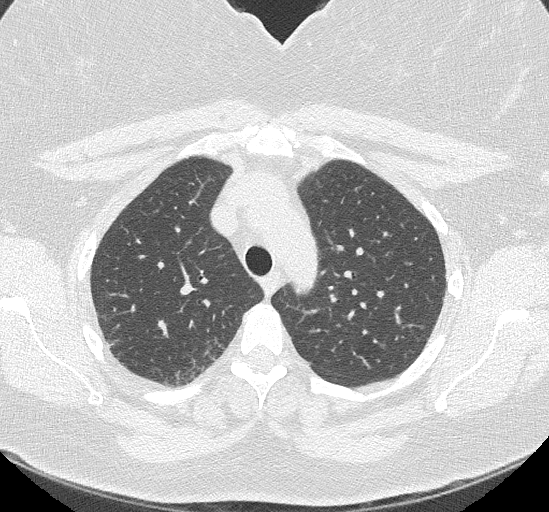
[im 257/304  lung]
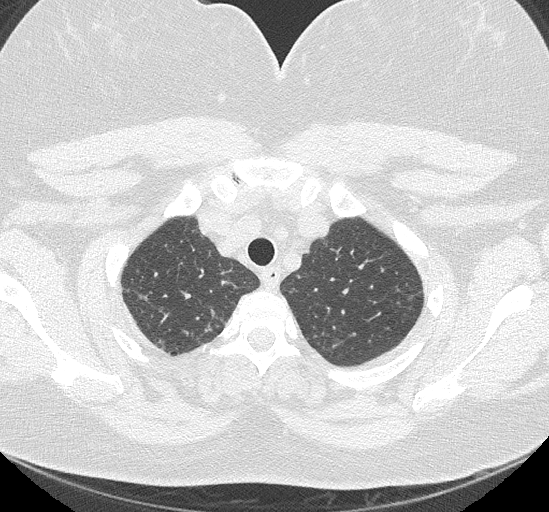
[im 280/304  lung]
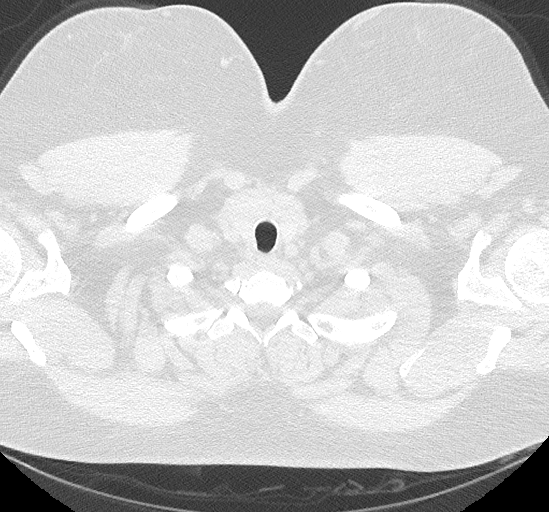

[14 of 36 positions shown; findings below may reference images not displayed]

FINDINGS: Cardiovascular: Normal heart size. No significant pericardial
effusion/thickening. Left anterior descending and right coronary
atherosclerosis. Atherosclerotic nonaneurysmal thoracic aorta.
Normal caliber pulmonary arteries.

Mediastinum/Nodes: No discrete thyroid nodules. Unremarkable
esophagus. No pathologically enlarged axillary, mediastinal or hilar
lymph nodes, noting limited sensitivity for the detection of hilar
adenopathy on this noncontrast study.

Lungs/Pleura: No pneumothorax. No pleural effusion. Mild
centrilobular and paraseptal emphysema. Solid 4 mm lingular
pulmonary nodule (series 8/image 92). No acute consolidative
airspace disease, lung masses or additional significant pulmonary
nodules. Mild patchy air trapping in the lower lobes on the
expiration sequence, without evidence of tracheobronchomalacia.
There are mild patchy regions of finely nodular thickening of the
peripheral peribronchovascular interstitium and interlobular septa
throughout the lungs bilaterally predominantly involving the upper
lobes with sparing of lower lungs, with associated minimal patchy
ground-glass opacity. No significant regions of traction
bronchiectasis, architectural distortion or frank honeycombing.

Upper abdomen: No acute abnormality.

Musculoskeletal: No aggressive appearing focal osseous lesions. Mild
thoracic spondylosis.
IMPRESSION: 1. Mild patchy regions of finely nodular thickening of the
peripheral peribronchovascular interstitium and interlobular septa
symmetrically involving the upper lungs. The appearance and
distribution are most suggestive of mild pulmonary sarcoidosis.
Findings are suggestive of an alternative diagnosis (not UIP) per
consensus guidelines: Diagnosis of Idiopathic Pulmonary Fibrosis: An
Official ATS/ERS/JRS/ALAT Clinical Practice Guideline. Am J Respir
Crit Care Med Vol 198, [place] 5, [id number][DATE].
2. Mild patchy air trapping in the lower lobes, indicative of small
airways disease.
3. Two-vessel coronary atherosclerosis.
4. Solid 4 mm lingular pulmonary nodule. Suggest attention on
follow-up high-resolution chest CT in 12 months.
5. Aortic Atherosclerosis ([id number]-M8O.O) and Emphysema ([id number].V).
